# Patient Record
Sex: Female | Born: 1980
Health system: Southern US, Community
[De-identification: ages and names within clinical notes are randomized; demographics above are authoritative.]

## PROBLEM LIST (undated history)

## (undated) DIAGNOSIS — F419 Anxiety disorder, unspecified: Secondary | ICD-10-CM

## (undated) DIAGNOSIS — G4700 Insomnia, unspecified: Secondary | ICD-10-CM

## (undated) DIAGNOSIS — Z720 Tobacco use: Secondary | ICD-10-CM

## (undated) DIAGNOSIS — R51 Headache: Secondary | ICD-10-CM

## (undated) DIAGNOSIS — R519 Headache, unspecified: Secondary | ICD-10-CM

## (undated) HISTORY — DX: Anxiety disorder, unspecified: F41.9

## (undated) HISTORY — DX: Headache, unspecified: R51.9

## (undated) HISTORY — DX: Headache: R51

## (undated) HISTORY — DX: Insomnia, unspecified: G47.00

## (undated) HISTORY — PX: NO PAST SURGERIES: SHX2092

## (undated) HISTORY — DX: Tobacco use: Z72.0

---
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
## 2001-01-09 ENCOUNTER — Emergency Department (HOSPITAL_COMMUNITY): Admission: EM | Admit: 2001-01-09 | Discharge: 2001-01-09 | Payer: Self-pay | Admitting: Emergency Medicine

## 2001-01-09 ENCOUNTER — Encounter: Payer: Self-pay | Admitting: Emergency Medicine

## 2004-12-16 ENCOUNTER — Ambulatory Visit: Payer: Self-pay | Admitting: Family Medicine

## 2004-12-20 ENCOUNTER — Ambulatory Visit: Payer: Self-pay | Admitting: Family Medicine

## 2005-08-29 ENCOUNTER — Other Ambulatory Visit: Admission: RE | Admit: 2005-08-29 | Discharge: 2005-08-29 | Payer: Self-pay | Admitting: Obstetrics and Gynecology

## 2006-06-04 ENCOUNTER — Ambulatory Visit: Payer: Self-pay | Admitting: Family Medicine

## 2006-06-05 ENCOUNTER — Ambulatory Visit: Payer: Self-pay | Admitting: Family Medicine

## 2006-07-09 ENCOUNTER — Encounter: Admission: RE | Admit: 2006-07-09 | Discharge: 2006-07-09 | Payer: Self-pay | Admitting: Family Medicine

## 2006-07-09 ENCOUNTER — Ambulatory Visit: Payer: Self-pay | Admitting: Family Medicine

## 2006-08-14 ENCOUNTER — Emergency Department (HOSPITAL_COMMUNITY): Admission: EM | Admit: 2006-08-14 | Discharge: 2006-08-14 | Payer: Self-pay | Admitting: Emergency Medicine

## 2006-10-24 ENCOUNTER — Ambulatory Visit: Payer: Self-pay | Admitting: Family Medicine

## 2007-02-04 ENCOUNTER — Ambulatory Visit: Payer: Self-pay | Admitting: Internal Medicine

## 2007-02-15 ENCOUNTER — Ambulatory Visit: Payer: Self-pay | Admitting: Family Medicine

## 2007-02-15 LAB — CONVERTED CEMR LAB: hCG, Beta Chain, Quant, S: <5 milliintl units/mL

## 2008-01-19 DIAGNOSIS — J069 Acute upper respiratory infection, unspecified: Secondary | ICD-10-CM

## 2008-01-19 HISTORY — DX: Acute upper respiratory infection, unspecified: J06.9

## 2008-01-24 ENCOUNTER — Telehealth: Payer: Self-pay | Admitting: Family Medicine

## 2008-01-24 ENCOUNTER — Ambulatory Visit: Payer: Self-pay | Admitting: Family Medicine

## 2008-01-24 DIAGNOSIS — E663 Overweight: Secondary | ICD-10-CM

## 2008-01-24 DIAGNOSIS — N912 Amenorrhea, unspecified: Secondary | ICD-10-CM

## 2008-01-24 DIAGNOSIS — F17200 Nicotine dependence, unspecified, uncomplicated: Secondary | ICD-10-CM | POA: Insufficient documentation

## 2008-01-24 HISTORY — DX: Nicotine dependence, unspecified, uncomplicated: F17.200

## 2008-01-24 HISTORY — DX: Overweight: E66.3

## 2008-01-24 HISTORY — DX: Amenorrhea, unspecified: N91.2

## 2008-01-24 LAB — CONVERTED CEMR LAB: Beta hcg, urine, semiquantitative: NEGATIVE

## 2010-09-14 ENCOUNTER — Encounter: Admission: RE | Admit: 2010-09-14 | Discharge: 2010-09-14 | Payer: Self-pay | Admitting: Gastroenterology

## 2011-08-31 ENCOUNTER — Telehealth: Payer: Self-pay | Admitting: Family Medicine

## 2011-08-31 ENCOUNTER — Ambulatory Visit (INDEPENDENT_AMBULATORY_CARE_PROVIDER_SITE_OTHER): Payer: PRIVATE HEALTH INSURANCE | Admitting: Family Medicine

## 2011-08-31 ENCOUNTER — Encounter: Payer: Self-pay | Admitting: Family Medicine

## 2011-08-31 VITALS — BP 110/80 | Temp 98.2°F | Ht 61.0 in | Wt 203.0 lb

## 2011-08-31 DIAGNOSIS — F411 Generalized anxiety disorder: Secondary | ICD-10-CM

## 2011-08-31 DIAGNOSIS — F17200 Nicotine dependence, unspecified, uncomplicated: Secondary | ICD-10-CM

## 2011-08-31 DIAGNOSIS — N912 Amenorrhea, unspecified: Secondary | ICD-10-CM

## 2011-08-31 HISTORY — DX: Generalized anxiety disorder: F41.1

## 2011-08-31 MED ORDER — LORAZEPAM 1 MG PO TABS: 1.0000 mg | ORAL_TABLET | Freq: Two times a day (BID) | ORAL | Status: AC

## 2011-08-31 NOTE — Progress Notes (Signed)
Addended by: Bonnye Fava on: 08/31/2011 11:58 AM   Modules accepted: Orders

## 2011-08-31 NOTE — Patient Instructions (Signed)
Call and make arrangements to see Renee Barrett, ASAP also, call and get an appointment to see Dr. [name], [name].  Once the anxiety issue is addressed and then lets retry the smoking cessation program with the chantix........  We will call you this afternoon about your blood work

## 2011-08-31 NOTE — Telephone Encounter (Signed)
Pt called req lab results. Pls call asap.   °

## 2011-08-31 NOTE — Progress Notes (Signed)
  Subjective:    Patient ID: Renee Barrett, female    DOB: 1981-05-28, 30 y.o.   MRN: [id number]  HPI Renee Barrett is a 30 year old single female, who smoker, one pack of cigarettes a day, and comes in today for evaluation of 3 problems.  Her most pressing problem now as anxiety.  She states she felt this way for many years.  However, in the last couple months it's gotten worse.  Is now affecting her job.  She states her mother and sister both have anxiety and depression.  She got some Xanax at an urgent care and would like to take that.  I recommended she take a low dose Ativan, and we will help set up for psychiatric evaluation, which will include counseling with Judithe Modest, and a medical evaluation by Dr. Prescilla Sours.  She's tried Prozac, Zoloft, antidepressants in the past, and they don't seem to help.  He also has side effects from the medication.  She sees Dr. Henderson Cloud because of possible PCO left eye and has irregular periods.  She is not using anything for birth control.  She had a normal period 826 to 57 and then when she was to just had a day or two of a little spotting.  She does not feel pregnant.  However, she would like to serum pregnancy test.  We have tried two rounds of Entex because of her tobacco abuse, and she states chantix doesn't work.   Review of Systems    General psychiatric GYN review of systems otherwise negative Objective:   Physical Exam  Slightly obese, female, in no acute distress.  Examination the abdomen is negative.  She is appropriate.  She is oriented x 3 not tearful.  Not suicidal      Assessment & Plan:  Chronic anxiety disorder.  Plan refer to Dr. Rolly Pancake, [name], and Judithe Modest for counseling.  Ativan 1 mg b.i.d. Temporarily.  Serum pregnancy test to rule out pregnancy.  Since she is amenorrheic.  History of tobacco abuse.  We tried the smoking cessation program once her anxiety has been addressed

## 2011-09-01 ENCOUNTER — Telehealth: Payer: Self-pay | Admitting: *Deleted

## 2011-09-01 NOTE — Telephone Encounter (Signed)
Please call pt re: HCG results.  She REALLY wants to know today.

## 2011-09-01 NOTE — Telephone Encounter (Signed)
Patient is aware of lab report

## 2011-12-08 ENCOUNTER — Encounter: Payer: Self-pay | Admitting: Dietician

## 2011-12-08 ENCOUNTER — Encounter: Payer: PRIVATE HEALTH INSURANCE | Attending: Obstetrics and Gynecology | Admitting: Dietician

## 2011-12-08 DIAGNOSIS — Z713 Dietary counseling and surveillance: Secondary | ICD-10-CM | POA: Insufficient documentation

## 2011-12-08 DIAGNOSIS — E663 Overweight: Secondary | ICD-10-CM

## 2011-12-08 DIAGNOSIS — E669 Obesity, unspecified: Secondary | ICD-10-CM | POA: Insufficient documentation

## 2011-12-08 NOTE — Patient Instructions (Signed)
   Try to keep your carb intake to 30 gm per meal.  Read labels and aim for sugar levels of 0-9 gm per serving.  Aim for fiber of at least 2-3 gm or more per serving.  Get back to the gym, aim for 3-4 times per week.  Remember the exercise will lower blood glucose and limit insulin production.  Exercise will decrease insulin resistance.  [employer] is a web site that will facilitate weight loss.  Use the calorieking.com site or google the restaurant for the carb values.

## 2011-12-08 NOTE — Progress Notes (Signed)
  Medical Nutrition Therapy:  Appt start time: 0800 end time:  0900.  Assessment:  Primary concerns today: Weight loss.  Has established a goal of loosing 50 lb by June of 2013.  She has steadily gained and maintained 30-35 lb since high school.  She has had issues with irregular periods and primarily no periods the majority of the time.  She is diagnosed with Metabolic syndrome along with PCOS.  She has tried many times to lose weight, only to regain the weight.  She is becoming engaged and wishes to plan marriage and pregnancy.  Appears willing to begin to make the life style changes that are necessary for the weight loss. Uses regular soda and sweetened tea, frequents fast food restaurants and does little meal/food planning. Does cook and her significant other enjoys grocery shopping.   MEDICATIONS: Prilosec OTC and Sprintec for birth control.  DIETARY INTAKE:  24-hr recall:  B (7:30 AM): Biscuit/ham, egg, cheese, with fries ([employer]), Sweet tea, small eats 1/2 of the meal OR Oatmeal(weight control or flavored) yogurt, fat free, tea sweet tea.  Weekends are two meals per day.   Snk (none AM) :none  L (12:30 PM): [employer] 2 pieces of fried chicken and mashed potatoes, and sweet tea.  OR Grill chicken sandwich or a salad or [place] wrap OR leftovers from dinner.  Snk ( PM): none D (6:30 PM): BBQ sandwich and mash potatoes. And sweet tea. 16 oz.  Snk ( PM): none Beverages: Sweet tea and soda, Dr. Reino Kent or [facility].   Recent physical activity: trying to get to the gym for 60 minutes of cardio classes 2-3 times per week. For the last few weeks has not been as successful at getting there.  Encouraged her to aim for 3-4 times for week and as much activity as possible.    Estimated energy needs:Ht:61 inches, Wt:207.7 lb (94.4 kg) Adjusted Wt: 138 lb. (63 Kg) 1300 calories 140-145 g carbohydrates 95-100 g protein 34-36 g fat  Progress Towards Goal(s):  In progress.   Nutritional Diagnosis:    [place]-3.3 Overweight/obesity As related to frequently eating at fast food restaurants, increased use of sweetened beverages and fried and fatty foods.  As evidenced by Weight of 207.7 lb with a BMI of 38.7%.    Intervention:  Nutrition Review of weight loss recommendations.  Primarily, needs to omit the concentrated sweets and fatty foods.  Has a regular meal pattern, but has no planning is dependent on the time and fast foods.  Stressed planning.    Handouts given during visit include:  Provided a menu plan for 30 gm or CHO per meal  Provided the Novo Nordisk Carb Counting guide for referencing her serving sizes and food suggestions.  Provided the Weight Loss Guide with individualized strategies to aim for.  Monitoring/Evaluation:  Dietary intake, exercise,  and body weight to call for follow-up as her insurance carrier Katarzyna Wolven allow.  Advised her to e-mail questions to me and to utilize the scales at [facility] for checking her weight periodically.

## 2012-12-30 ENCOUNTER — Encounter (HOSPITAL_BASED_OUTPATIENT_CLINIC_OR_DEPARTMENT_OTHER): Payer: Self-pay | Admitting: *Deleted

## 2012-12-30 ENCOUNTER — Emergency Department (HOSPITAL_BASED_OUTPATIENT_CLINIC_OR_DEPARTMENT_OTHER)
Admission: EM | Admit: 2012-12-30 | Discharge: 2012-12-30 | Disposition: A | Discharge status: Home / Self Care | Payer: Commercial Managed Care - PPO | Attending: Emergency Medicine | Admitting: Emergency Medicine

## 2012-12-30 DIAGNOSIS — M436 Torticollis: Secondary | ICD-10-CM | POA: Insufficient documentation

## 2012-12-30 DIAGNOSIS — F17200 Nicotine dependence, unspecified, uncomplicated: Secondary | ICD-10-CM | POA: Insufficient documentation

## 2012-12-30 DIAGNOSIS — M542 Cervicalgia: Secondary | ICD-10-CM | POA: Insufficient documentation

## 2012-12-30 DIAGNOSIS — Z79899 Other long term (current) drug therapy: Secondary | ICD-10-CM | POA: Insufficient documentation

## 2012-12-30 MED ORDER — IBUPROFEN 600 MG PO TABS: 600.0000 mg | ORAL_TABLET | Freq: Four times a day (QID) | ORAL | Status: DC | PRN

## 2012-12-30 MED ORDER — OXYCODONE-ACETAMINOPHEN 5-325 MG PO TABS: 1.0000 | ORAL_TABLET | Freq: Four times a day (QID) | ORAL | Status: DC | PRN

## 2012-12-30 MED ORDER — IBUPROFEN 800 MG PO TABS
800.0000 mg | ORAL_TABLET | Freq: Once | ORAL | Status: AC
  Administered 2012-12-30: 800 mg via ORAL
  Filled 2012-12-30: qty 1

## 2012-12-30 MED ORDER — CYCLOBENZAPRINE HCL 5 MG PO TABS: 5.0000 mg | ORAL_TABLET | Freq: Three times a day (TID) | ORAL | Status: DC | PRN

## 2012-12-30 NOTE — ED Notes (Addendum)
Pt c/o neck pain and stiffness x 2 days denies injury

## 2012-12-30 NOTE — ED Provider Notes (Signed)
History  This chart was scribed for Ethelda Chick, MD by Shari Heritage, ED Scribe. The patient was seen in room MH05/MH05. Patient's care was started at 1527.  CSN: [id number]  Arrival date & time 12/30/12  1520   First MD Initiated Contact with Patient 12/30/12 1527      Chief Complaint  Patient presents with  . Torticollis     Patient is a 32 y.o. female presenting with musculoskeletal pain. The history is provided by the patient. No language interpreter was used.  Muscle Pain This is a new problem. The current episode started 2 days ago. The problem occurs constantly. The problem has not changed since onset.Nothing relieves the symptoms. Treatments tried: muscle relaxers, narcotic pain medicines. The treatment provided no relief.    HPI Comments: Renee Barrett is a 32 y.o. female who presents to the Emergency Department complaining of moderate, constant, non-radiating, dull right neck pain and neck stiffness onset 2 days ago. Patient states that she has a history of "whiplash" and has had occasional pain flare ups. She denies any new injury or trauma to the neck. No fever, numbness or weakness in upper extremities. Patient says that she saw her chiropractor today for a readjustment, but she actually felt worse after the appointment. Patient says that she took 1 muscle relaxer and 1 narcotic pain pill 2.5 hours ago with no significant relief. Patient reports no other significant past medical or surgical history.   Past Medical History  Diagnosis Date  . Tobacco abuse     History reviewed. No pertinent past surgical history.  History reviewed. No pertinent family history.  History  Substance Use Topics  . Smoking status: Current Every Day Smoker -- 1.0 packs/day    Types: Cigarettes  . Smokeless tobacco: Never Used  . Alcohol Use: Yes    OB History    Grav Para Term Preterm Abortions TAB SAB Ect Mult Living                  Review of Systems  Constitutional: Negative  for fever.  HENT: Positive for neck pain and neck stiffness.   Neurological: Negative for weakness and numbness.  All other systems reviewed and are negative.    Allergies  Review of patient's allergies indicates no known allergies.  Home Medications   Current Outpatient Rx  Name  Route  Sig  Dispense  Refill  . CYCLOBENZAPRINE HCL 5 MG PO TABS   Oral   Take 1 tablet (5 mg total) by mouth 3 (three) times daily as needed for muscle spasms.   20 tablet   0   . IBUPROFEN 600 MG PO TABS   Oral   Take 1 tablet (600 mg total) by mouth every 6 (six) hours as needed for pain.   30 tablet   0   . SPRINTEC 28 PO   Oral   Take 1 tablet by mouth daily.           Marland Kitchen OMEPRAZOLE 20 MG PO CPDR   Oral   Take 20 mg by mouth 2 (two) times daily.           . OXYCODONE-ACETAMINOPHEN 5-325 MG PO TABS   Oral   Take 1-2 tablets by mouth every 6 (six) hours as needed for pain.   15 tablet   0     Triage Vitals: BP 120/76  Pulse 81  Temp 97.8 F (36.6 C) (Oral)  Resp 16  Ht 5' (1.524 m)  Wt  206 lb (93.441 kg)  BMI 40.23 kg/m2  SpO2 99%  LMP 12/05/2012  Physical Exam  Nursing note and vitals reviewed. Constitutional: She is oriented to person, place, and time. She appears well-developed and well-nourished.  HENT:  Head: Normocephalic and atraumatic.  Eyes: Conjunctivae normal and EOM are normal. Pupils are equal, round, and reactive to light.  Neck: Normal range of motion. Neck supple.  Cardiovascular: Normal rate, regular rhythm and normal heart sounds.   Pulmonary/Chest: Effort normal and breath sounds normal.  Abdominal: Soft. Bowel sounds are normal.  Musculoskeletal: Normal range of motion. She exhibits tenderness.       Tenderness to palpation over right cervical paraspinous and trapezius distribution with ROM limited due to pain. No midline tenderness to palpation of cervical region.  Neurological: She is alert and oriented to person, place, and time.       Strength  5/5 and symmetric in bilateral upper extremities. Sensation intact.   Skin: Skin is warm and dry.  Psychiatric: She has a normal mood and affect.    ED Course  Procedures (including critical care time) DIAGNOSTIC STUDIES: Oxygen Saturation is 99% on room air, normal by my interpretation.    COORDINATION OF CARE: 3:46 PM- Patient informed of current plan for treatment and evaluation and agrees with plan at this time.      Labs Reviewed - No data to display No results found.   1. Torticollis       MDM  Pt presenting with c/o right sided neck pain.  Has hx of "whiplash" and pain is worse after chiropractic manipulation today. No new trauma.  No weakness or radiation of pain into upper extremity.  Advised ibuprofen, muscle relaxer, pain medication.  No indication for imaging today.  D/w patient that if symptoms continue she may need an MRI on an outpatient basis.  Discharged with strict return precautions.  Pt agreeable with plan.   I personally performed the services described in this documentation, which was scribed in my presence. The recorded information has been reviewed and is accurate.    Ethelda Chick, MD 12/30/12 [phone]

## 2013-01-24 ENCOUNTER — Encounter: Payer: Self-pay | Admitting: Family Medicine

## 2013-01-24 ENCOUNTER — Telehealth: Payer: Self-pay | Admitting: Family Medicine

## 2013-01-24 ENCOUNTER — Ambulatory Visit (INDEPENDENT_AMBULATORY_CARE_PROVIDER_SITE_OTHER): Payer: Commercial Managed Care - PPO | Admitting: Family Medicine

## 2013-01-24 VITALS — BP 110/80 | HR 108 | Temp 98.1°F | Wt 218.0 lb

## 2013-01-24 DIAGNOSIS — M542 Cervicalgia: Secondary | ICD-10-CM

## 2013-01-24 DIAGNOSIS — L089 Local infection of the skin and subcutaneous tissue, unspecified: Secondary | ICD-10-CM

## 2013-01-24 MED ORDER — DOXYCYCLINE HYCLATE 100 MG PO TABS: 100.0000 mg | ORAL_TABLET | Freq: Two times a day (BID) | ORAL | Status: DC

## 2013-01-24 NOTE — Patient Instructions (Addendum)
Take antibiotic for finger  For neck pain: -heat for 15 minutes twice daily -topical sports creams for pain -Home Exercises  Follow up with your doctor in 1 month

## 2013-01-24 NOTE — Telephone Encounter (Signed)
Error

## 2013-01-24 NOTE — Progress Notes (Signed)
Chief Complaint  Patient presents with  . reocurrening left index finger infection  . pinched nerve in neck affecting wrist and eye twiching    HPI:  Acute visit:  Infected finger: -started 2 days ago -pain and a little bump on L index finger -has happened a few times in the past - L index finger -has had thourough work up in the past and report now if catches early with doxy it does not have to be drained -last time it got infected was about 1 years ago  ? Pinched nerve in neck: -tingling in bilat first few fingers and and forearms -neck pain from time to time -has a hx of carpal tunnel and cock up brace helped -also seen in ED in the past for her neck pain and told to follow up with PCP and might have DDD -neck pain has improved -also has twitching in eyelid on and off for one month   ROS: See pertinent positives and negatives per HPI.  Past Medical History  Diagnosis Date  . Tobacco abuse     No family history on file.  History   Social History  . Marital Status: Single    Spouse Name: N/A    Number of Children: N/A  . Years of Education: N/A   Social History Main Topics  . Smoking status: Current Every Day Smoker -- 1.00 packs/day    Types: Cigarettes  . Smokeless tobacco: Never Used  . Alcohol Use: Yes  . Drug Use: No  . Sexually Active: Yes    Birth Control/ Protection: None   Other Topics Concern  . None   Social History Narrative  . None    Current outpatient prescriptions:Norgestimate-Eth Estradiol (SPRINTEC 28 PO), Take 1 tablet by mouth daily.  , Disp: , Rfl: ;  cyclobenzaprine (FLEXERIL) 5 MG tablet, Take 1 tablet (5 mg total) by mouth 3 (three) times daily as needed for muscle spasms., Disp: 20 tablet, Rfl: 0;  doxycycline (VIBRA-TABS) 100 MG tablet, Take 1 tablet (100 mg total) by mouth 2 (two) times daily., Disp: 20 tablet, Rfl: 0 ibuprofen (ADVIL,MOTRIN) 600 MG tablet, Take 1 tablet (600 mg total) by mouth every 6 (six) hours as needed for  pain., Disp: 30 tablet, Rfl: 0;  omeprazole (PRILOSEC) 20 MG capsule, Take 20 mg by mouth 2 (two) times daily.  , Disp: , Rfl: ;  oxyCODONE-acetaminophen (PERCOCET/ROXICET) 5-325 MG per tablet, Take 1-2 tablets by mouth every 6 (six) hours as needed for pain., Disp: 15 tablet, Rfl: 0  EXAM:  Filed Vitals:   01/24/13 1042  BP: 110/80  Pulse: 108  Temp: 98.1 F (36.7 C)    Body mass index is 42.58 kg/(m^2).  GENERAL: vitals reviewed and listed above, alert, oriented, appears well hydrated and in no acute distress  HEENT: atraumatic, conjunttiva clear, no obvious abnormalities on inspection of external nose and ears  NECK: no obvious masses on inspection - minimal post cervical paraspinal muscle and trapezius muscle TTP, normal ROM in neck, neg Spurling, normal muscle strength and sensation throughout in UEs bilat  MS: moves all extremities without noticeable abnormality  SKIN: L index finger with very small erythematous papule  PSYCH: pleasant and cooperative, no obvious depression or anxiety  ASSESSMENT AND PLAN:  Discussed the following assessment and plan:  Finger infection - Plan: doxycycline (VIBRA-TABS) 100 MG tablet  Neck pain  -doxy given for finger per hx  -suspect postural related muscle strain as etiology for neck pain and mild carpal  tunnel -advised cock up brace at night for wrist and provided HEP for neck -pt will follow up with PCP -Patient advised to return or notify a doctor immediately if symptoms worsen or persist or new concerns arise.  Patient Instructions  Take antibiotic for finger  For neck pain: -heat for 15 minutes twice daily -topical sports creams for pain -Home Exercises  Follow up with your doctor in 1 month     KIM, [facility] R.

## 2013-02-05 ENCOUNTER — Telehealth: Payer: Self-pay | Admitting: Family Medicine

## 2013-02-05 NOTE — Telephone Encounter (Signed)
Pt was taking abx now has yeast inf. Please call in 2 diflucan pill into [facility] [phone]

## 2013-02-05 NOTE — Telephone Encounter (Signed)
Pls advise.  

## 2013-02-06 MED ORDER — FLUCONAZOLE 150 MG PO TABS: ORAL_TABLET | ORAL | Status: DC

## 2013-02-06 NOTE — Addendum Note (Signed)
Addended by: Azucena Freed on: 02/06/2013 10:55 AM   Modules accepted: Orders

## 2013-02-06 NOTE — Telephone Encounter (Signed)
Please let her know if any persistent symptoms, pelvic or abd pain, fevers, or urinary symptoms she needs to see a doctediimmediatly this medication interferes with the effectiveness of her birth control pills. Marland Kitchen

## 2013-02-06 NOTE — Telephone Encounter (Signed)
Called and spoke with pt and rx faxed to pharmacy.

## 2013-02-18 ENCOUNTER — Ambulatory Visit: Payer: Commercial Managed Care - PPO | Admitting: Family Medicine

## 2013-02-19 ENCOUNTER — Encounter: Payer: Self-pay | Admitting: Family Medicine

## 2013-02-19 ENCOUNTER — Ambulatory Visit (INDEPENDENT_AMBULATORY_CARE_PROVIDER_SITE_OTHER): Payer: Commercial Managed Care - PPO | Admitting: Family Medicine

## 2013-02-19 VITALS — BP 110/80 | Temp 98.3°F | Wt 217.0 lb

## 2013-02-19 DIAGNOSIS — G8929 Other chronic pain: Secondary | ICD-10-CM

## 2013-02-19 DIAGNOSIS — F411 Generalized anxiety disorder: Secondary | ICD-10-CM

## 2013-02-19 DIAGNOSIS — F418 Other specified anxiety disorders: Secondary | ICD-10-CM

## 2013-02-19 DIAGNOSIS — M549 Dorsalgia, unspecified: Secondary | ICD-10-CM

## 2013-02-19 MED ORDER — CYCLOBENZAPRINE HCL 5 MG PO TABS: 5.0000 mg | ORAL_TABLET | Freq: Every evening | ORAL | Status: DC | PRN

## 2013-02-19 NOTE — Patient Instructions (Addendum)
Try to establish regular aerobic exercise. Continue moist heat and muscle massage

## 2013-02-19 NOTE — Progress Notes (Signed)
  Subjective:    Patient ID: Renee Barrett, female    DOB: 07-Mar-1981, 32 y.o.   MRN: [id number]  HPI  Intermittent neck pain.   History of cervical pain for about 2 years Onset after moving neck and felt "pop". Usually right sided pain. No radiculopathy Chiropractic care made worse. Intermittent.  Sharp, "shock-like" pain-fleeting. No numbness or weakness. Treated with opioids and muscle relaxer in past. Flexeril helped slightly.  Deals with tremendous job stress. Symptoms feels she has difficulty coping. No depressive symptoms. She is considered counseling but is never pursued in the past. Briefly took Xanax in past which she thought helped  Past Medical History  Diagnosis Date  . Tobacco abuse    No past surgical history on file.  reports that she has been smoking Cigarettes.  She has been smoking about 1.00 pack per day. She has never used smokeless tobacco. She reports that  drinks alcohol. She reports that she does not use illicit drugs. family history is not on file. No Known Allergies    Review of Systems  Constitutional: Negative for fever, chills, appetite change and unexpected weight change.  HENT: Positive for neck pain.   Respiratory: Negative for cough and shortness of breath.   Cardiovascular: Negative for chest pain.  Musculoskeletal: Positive for back pain.  Neurological: Negative for weakness and numbness.  Hematological: Negative for adenopathy. Does not bruise/bleed easily.  Psychiatric/Behavioral: Negative for dysphoric mood and agitation. The patient is nervous/anxious.        Objective:   Physical Exam  Constitutional: She appears well-developed and well-nourished. No distress.  Neck: Neck supple. No thyromegaly present.  Cardiovascular: Normal rate and regular rhythm.   Pulmonary/Chest: Effort normal and breath sounds normal. No respiratory distress. She has no wheezes. She has no rales.  Musculoskeletal: She exhibits no edema.  Full range of  motion cervical spine  Neurological:  No upper extremity weakness or numbness. Symmetric upper extremity reflexes  Psychiatric: She has a normal mood and affect. Her behavior is normal.          Assessment & Plan:  #1 chronic intermittent upper back pain and neck pain. Suspect soft tissue muscular etiology. Her job requires extensive typing. Nonfocal neuro exam. Refilled Flexeril 5 mg each bedtime for as needed use. Moist heat. Topical rubs. Aerobic exercise such as walking.  Consider muscle massage and possible physical therapy if symptoms persist #2 situational stress/anxiety. We have recommended against any regular use of benzodiazepines. We strongly recommended counseling as a first step. Consider SSRI such as sertraline if symptoms persist or worsen

## 2013-03-04 ENCOUNTER — Encounter: Payer: Commercial Managed Care - PPO | Admitting: Family Medicine

## 2013-03-04 ENCOUNTER — Telehealth: Payer: Self-pay | Admitting: Family Medicine

## 2013-03-04 NOTE — Telephone Encounter (Signed)
Patient Information:  Caller Name: Bonita  Phone: [phone]  Patient: Renee Barrett, Renee Barrett  Gender: Female  DOB: 1981/02/28  Age: 32 Years  PCP: Evelena Peat [facility])  Pregnant: No  Office Follow Up:  Does the office need to follow up with this patient?: Yes  Instructions For The Office: Patient would like appt for heavy vaginal bleeding. Please contact [phone]  RN Note:  Patient is wanting an appt for heavy vaginal bleeding. Please contact her [phone].   to assist with scheduling for today.  Symptoms  Reason For Call & Symptoms: Patient states she has had her period for 31 days.  She has a history of PCOS and they started her  BCP and Metformin at the beginning of March. She see's   Dr.Horvath [facility]). She stopped BCP and Metformin due to nausea  .  She is cramping and bleeding and they cannot get her into their office.. She is wearing tampons and pads and is changing every 2 hours. Bright Red blood with small clots.   LMP 3/2 /2014.  Reviewed Health History In EMR: Yes  Reviewed Medications In EMR: Yes  Reviewed Allergies In EMR: Yes  Reviewed Surgeries / Procedures: Yes  Date of Onset of Symptoms: 02/02/2013  Treatments Tried: BCP and Metformin  Treatments Tried Worked: No OB / GYN:  LMP: 02/02/2013  Guideline(s) Used:  Vaginal Bleeding - Abnormal  Disposition Per Guideline:   See Today in Office  Reason For Disposition Reached:   Patient wants to be seen  Advice Given:  Call Back If:  Bleeding becomes worse  You become worse.  RN Overrode Recommendation:  Make Appointment  Patient would like appt for heavy vaginal bleeding. Please contact [phone]

## 2013-03-04 NOTE — Telephone Encounter (Signed)
Spoke with patient and an appointment made 

## 2013-03-04 NOTE — Progress Notes (Signed)
Per PCP needs to see gyn recently treating her for gyn issues. If severe bleeding, feeling lightheaded needs to go to hospital. This encounter was created in error - please disregard. This encounter was created in error - please disregard.

## 2013-08-27 ENCOUNTER — Ambulatory Visit (INDEPENDENT_AMBULATORY_CARE_PROVIDER_SITE_OTHER): Payer: Commercial Managed Care - PPO | Admitting: Family Medicine

## 2013-08-27 ENCOUNTER — Encounter: Payer: Self-pay | Admitting: Family Medicine

## 2013-08-27 VITALS — BP 120/80 | HR 87 | Temp 98.1°F | Wt 214.0 lb

## 2013-08-27 DIAGNOSIS — Z9109 Other allergy status, other than to drugs and biological substances: Secondary | ICD-10-CM

## 2013-08-27 DIAGNOSIS — G43909 Migraine, unspecified, not intractable, without status migrainosus: Secondary | ICD-10-CM

## 2013-08-27 DIAGNOSIS — M542 Cervicalgia: Secondary | ICD-10-CM

## 2013-08-27 DIAGNOSIS — G8929 Other chronic pain: Secondary | ICD-10-CM

## 2013-08-27 MED ORDER — SUMATRIPTAN SUCCINATE 100 MG PO TABS: 100.0000 mg | ORAL_TABLET | ORAL | Status: DC | PRN

## 2013-08-27 MED ORDER — DICLOFENAC SODIUM 75 MG PO TBEC: 75.0000 mg | DELAYED_RELEASE_TABLET | Freq: Two times a day (BID) | ORAL | Status: DC

## 2013-08-27 NOTE — Progress Notes (Signed)
  Subjective:    Patient ID: Renee Barrett, female    DOB: 11-09-1981, 32 y.o.   MRN: [id number]  HPI Here for 3 things. First she has chronic pain in the neck, the lower back , and the legs. Advil no longer helps like it used to. Also she has had a cough for several week, along with some PND. NO fever. Also she has had some HAs in the past month or two which start around the left eye and then generalize. They become more severe and they throb. Her vision gets blurred and she gets nauseated. She was diagnosed with migraines some years ago.    Review of Systems  Constitutional: Negative.   HENT: Positive for neck pain and neck stiffness.   Eyes: Positive for visual disturbance.  Respiratory: Negative.   Cardiovascular: Negative.   Musculoskeletal: Positive for back pain.  Neurological: Positive for headaches. Negative for dizziness, tremors, seizures, syncope, facial asymmetry, weakness, light-headedness and numbness.       Objective:   Physical Exam  Constitutional: She is oriented to person, place, and time. She appears well-developed and well-nourished.  HENT:  Head: Normocephalic and atraumatic.  Right Ear: External ear normal.  Left Ear: External ear normal.  Nose: Nose normal.  Mouth/Throat: Oropharynx is clear and moist.  Eyes: Conjunctivae and EOM are normal. Pupils are equal, round, and reactive to light.  Neck: Neck supple. No thyromegaly present.  Pulmonary/Chest: Effort normal and breath sounds normal.  Lymphadenopathy:    She has no cervical adenopathy.  Neurological: She is alert and oriented to person, place, and time. She has normal reflexes. No cranial nerve deficit. She exhibits normal muscle tone. Coordination normal.          Assessment & Plan:  Try Diclofenac prn for neck or back pain. She is having migraines so she will try Sumatriptan for these. The cough is probably allergic so she will try Zyrtec or Allegra.

## 2014-07-20 ENCOUNTER — Telehealth: Payer: Self-pay | Admitting: Family Medicine

## 2014-07-20 NOTE — Telephone Encounter (Signed)
Patient Information:  Caller Name: Marchelle Folksmanda  Phone: [phone]  Patient: Renee Barrett, [place]  Gender: Female  DOB: 05-13-1981  Age: 33 Years  PCP: Kelle Dartingodd, [facility](Family Practice)  Pregnant: No  Office Follow Up:  Does the office need to follow up with this patient?: No  Instructions For The Office: N/A  RN Note:  Dr Tawanna Coolerodd spoke with patient - scheduling appointment tomorrow 8/18  Symptoms  Reason For Call & Symptoms: chest pain 8/5 so much pain in chest could not get comfortable and pain lasted for hours,  Pain in center of chest, felt hot, dizzy, lightheaded and felt like going to pass out.  Took ASA 325mg .  Pain severe again on Saturday 8/8, this time broke out in sweat, pain in chest and left arm and fingers became numb.  Felt tired and weak, thought might be panic attack so took Xanax, layed down and rested.  Woke from sleep for 3rd episode on Saturday 8/15, felt like squeezing in chest for 2-4 minutes then went back to sleep.  At times dull ache or soreness in chest when presses.  No current chest pain or pressure, just soreness when touches.  Reviewed Health History In EMR: Yes  Reviewed Medications In EMR: Yes  Reviewed Allergies In EMR: Yes  Reviewed Surgeries / Procedures: Yes  Date of Onset of Symptoms: 07/08/2014  Treatments Tried: ASA 325 mg on 8/5  Treatments Tried Worked: No OB / GYN:  LMP: 06/03/2014  Guideline(s) Used:  Chest Pain  Disposition Per Guideline:   See Today in Office  Reason For Disposition Reached:   Intermittent chest pains persist > 3 days  Advice Given:  N/A  Patient Will Follow Care Advice:  YES

## 2014-07-21 ENCOUNTER — Ambulatory Visit (INDEPENDENT_AMBULATORY_CARE_PROVIDER_SITE_OTHER): Payer: Commercial Managed Care - PPO | Admitting: Family Medicine

## 2014-07-21 ENCOUNTER — Encounter: Payer: Self-pay | Admitting: Family Medicine

## 2014-07-21 VITALS — BP 110/80 | Temp 98.0°F | Wt 212.0 lb

## 2014-07-21 DIAGNOSIS — R519 Headache, unspecified: Secondary | ICD-10-CM | POA: Insufficient documentation

## 2014-07-21 DIAGNOSIS — R0789 Other chest pain: Secondary | ICD-10-CM

## 2014-07-21 DIAGNOSIS — G8929 Other chronic pain: Secondary | ICD-10-CM

## 2014-07-21 DIAGNOSIS — R071 Chest pain on breathing: Secondary | ICD-10-CM

## 2014-07-21 DIAGNOSIS — R51 Headache: Secondary | ICD-10-CM

## 2014-07-21 HISTORY — DX: Headache, unspecified: R51.9

## 2014-07-21 HISTORY — DX: Other chronic pain: G89.29

## 2014-07-21 HISTORY — DX: Other chest pain: R07.89

## 2014-07-21 NOTE — Progress Notes (Signed)
Pre visit review using our clinic review tool, if applicable. No additional management support is needed unless otherwise documented below in the visit note. 

## 2014-07-21 NOTE — Patient Instructions (Signed)
Motrin 600 mg twice daily with food  Neuro consult

## 2014-07-21 NOTE — Progress Notes (Signed)
   Subjective:    Patient ID: Renee Barrett, female    DOB: [birth year], 33 y.o.   MRN: [id number]  HPI Renee Barrett is a 33 year old female nonsmoker who comes in today for evaluation of a couple problems  She states on August 5 she was sitting at home working and developed a sharp upper anterior chest pain that seem to shoot into her back. This has been present since that time. It waxes and wanes in terms of intensity. It hurts if she presses on her chest it also hurts if she moves or takes a deep breath. No cardiac or pulmonary symptoms.  She had an episode of numbness in her left arm however she reports that she had a whiplash injury at one time. The numbness has since resolved.  She's been on birth control now for 6 months because her S. so had a vasectomy. The birth control pills apparently help stop her migraine headaches. Now she is back to having daily headaches.  She also states at some point somebody gave her some Xanax for tension and wants to know if I can give her some Xanax.......... I declined   Review of Systems Negative review of systems    Objective:   Physical Exam  Well-developed and nourished female no acute distress vital signs stable she's afebrile except for weight 212 pounds cardiopulmonary exam normal there is tenderness in right upper anterior chest wall palpation      Assessment & Plan:  Chest wall pain,,,,,,,,,,,,,, reassured,,,,,,,,, Motrin 600 twice a day  Chronic daily headaches,,,,,,,,,, neuro consult  Episode of weakness in her left arm question cervical disc,,,,,,,, also evaluated by neurology,

## 2014-09-09 ENCOUNTER — Ambulatory Visit (INDEPENDENT_AMBULATORY_CARE_PROVIDER_SITE_OTHER): Payer: Commercial Managed Care - PPO | Admitting: Neurology

## 2014-09-09 ENCOUNTER — Encounter: Payer: Self-pay | Admitting: Neurology

## 2014-09-09 VITALS — BP 108/82 | HR 100 | Resp 20 | Ht 61.0 in | Wt 216.2 lb

## 2014-09-09 DIAGNOSIS — F410 Panic disorder [episodic paroxysmal anxiety] without agoraphobia: Secondary | ICD-10-CM

## 2014-09-09 DIAGNOSIS — G43009 Migraine without aura, not intractable, without status migrainosus: Secondary | ICD-10-CM

## 2014-09-09 MED ORDER — AMITRIPTYLINE HCL 10 MG PO TABS: 10.0000 mg | ORAL_TABLET | Freq: Every day | ORAL | Status: DC

## 2014-09-09 NOTE — Patient Instructions (Signed)
1.  Start amitriptyline 10mg  at bedtime.  Call in 4 weeks with update 2.  Take Excedrin at earliest onset of headache.   3.  Stop caffeine.  4.  Limit use of pain relievers to no more than 2 days out of the week. 5.  Keep headache diary 6.  Follow up in 3 months.

## 2014-09-09 NOTE — Progress Notes (Signed)
NEUROLOGY CONSULTATION NOTE  Renee Barrett MRN: [id number] DOB: [birth year]  Referring provider: Dr. Tawanna Cooler Primary care provider: Dr. Tawanna Cooler  Reason for consult:  Headache  HISTORY OF PRESENT ILLNESS: Renee Barrett is a 33 year old right-handed woman with history of panic attacks who presents for headache.  Records reviewed.  Onset:  Since 33 years old. Location:  Varies (right retro-orbital, center of forehead, back of head) Quality:  Pressure, throbbing, stabbing Intensity:  8-9/10 for severe, 4-5/10 moderate Aura:  no Prodrome:  no Associated symptoms:  Nausea, phonophobia.  No photophobia, visual symptoms or autonomic symptoms. Duration:  1 hour with Excedrin, otherwise 4hrs Frequency:  6 days per month (1 day per month for severe).  Not associated with her period. Triggers/exacerbating factors:  Caffeine withdraw, hunger Relieving factors:  Eating a big meal, caffeine, sleep Activity:  Cannot function for severe migraines  Past abortive therapy:  sumatriptan po 100mg  (flu-like symptoms), ibuprofen 600mg  (lost effectiveness), Excedrin (helped) Past preventative therapy:  None Other past medications:  Tried Zoloft for depression, but had side effects.  Current abortive therapy:  acetaminophen 650mg  Current preventative therapy:  none  Caffeine:  1 cup of coffee and 16-20 oz Dr. Reino Kent daily Alcohol:  no Smoker:  1 ppd (trying to quit) Diet:  Could be better Exercise:  Not lately because she does not feel well Depression/stress:  She has depression. Sleep hygiene:  Overall, it is good. Family history of headache:  Mom, maternal grandmother  PAST MEDICAL HISTORY: Past Medical History  Diagnosis Date  . Tobacco abuse   . Headache     PAST SURGICAL HISTORY: No past surgical history on file.  MEDICATIONS: Current Outpatient Prescriptions on File Prior to Visit  Medication Sig Dispense Refill  . acetaminophen (TYLENOL) 325 MG tablet Take 650 mg by mouth every 6  (six) hours as needed.      Marland Kitchen omeprazole (PRILOSEC) 20 MG capsule Take 20 mg by mouth 2 (two) times daily.         No current facility-administered medications on file prior to visit.    ALLERGIES: No Known Allergies  FAMILY HISTORY: Family History  Problem Relation Age of Onset  . Thyroid disease Mother   . Diabetes Maternal Grandfather   . Cancer Paternal Grandmother     breast   . Rheum arthritis Mother     SOCIAL HISTORY: History   Social History  . Marital Status: Single    Spouse Name: N/A    Number of Children: N/A  . Years of Education: N/A   Occupational History  . Not on file.   Social History Main Topics  . Smoking status: Current Every Day Smoker -- 1.00 packs/day    Types: Cigarettes  . Smokeless tobacco: Never Used     Comment: pateint is aware this is unhealthy   . Alcohol Use: Yes     Comment: occ  . Drug Use: No  . Sexual Activity: Yes    Partners: Male    Birth Control/ Protection: None   Other Topics Concern  . Not on file   Social History Narrative  . No narrative on file    REVIEW OF SYSTEMS: Constitutional: No fevers, chills, or sweats, no generalized fatigue, change in appetite Eyes: No visual changes, double vision, eye pain Ear, nose and throat: No hearing loss, ear pain, nasal congestion, sore throat Cardiovascular: No chest pain, palpitations Respiratory:  No shortness of breath at rest or with exertion, wheezes GastrointestinaI: No nausea, vomiting, diarrhea,  abdominal pain, fecal incontinence Genitourinary:  No dysuria, urinary retention or frequency Musculoskeletal:  No neck pain, back pain Integumentary: No rash, pruritus, skin lesions Neurological: as above Psychiatric: No depression, insomnia, anxiety Endocrine: No palpitations, fatigue, diaphoresis, mood swings, change in appetite, change in weight, increased thirst Hematologic/Lymphatic:  No anemia, purpura, petechiae. Allergic/Immunologic: no itchy/runny eyes, nasal  congestion, recent allergic reactions, rashes  PHYSICAL EXAM: Filed Vitals:   09/09/14 0826  BP: 108/82  Pulse: 100  Resp: 20   General: No acute distress Head:  Normocephalic/atraumatic Neck: supple, no paraspinal tenderness, full range of motion Back: No paraspinal tenderness Heart: regular rate and rhythm Lungs: Clear to auscultation bilaterally. Vascular: No carotid bruits. Neurological Exam: Mental status: alert and oriented to person, place, and time, recent and remote memory intact, fund of knowledge intact, attention and concentration intact, speech fluent and not dysarthric, language intact. Cranial nerves: CN I: not tested CN II: pupils equal, round and reactive to light, visual fields intact, fundi unremarkable, without vessel changes, exudates, hemorrhages or papilledema. CN III, IV, VI:  full range of motion, no nystagmus, no ptosis CN V: facial sensation intact CN VII: upper and lower face symmetric CN VIII: hearing intact CN IX, X: gag intact, uvula midline CN XI: sternocleidomastoid and trapezius muscles intact CN XII: tongue midline Bulk & Tone: normal, no fasciculations. Motor: 5/5 throughout Sensation: temperature and vibration intact. Deep Tendon Reflexes: 2+ throughout, toes downgoing Finger to nose testing: no dysmetria Heel to shin: no dysmetria Gait: normal station and stride.  Able to turn and walk in tandem. Romberg negative.  IMPRESSION: Migraine without aura  PLAN: 1.  Will try amitriptyline 10mg  at bedtime.  She cannot take a medication that is only a capsule (difficulty swallowing) 2.  Excedrin for abortive therapy 3.  Stop caffeine 4.  Smoking cessation 5.  Headache diary 6.  Exercise 7.  Follow up in 3 months.  Thank you for allowing me to take part in the care of this patient.  Shon MilletAdam Jaffe, DO  CC:  Kelle DartingJeffrey Todd, MD

## 2014-09-17 ENCOUNTER — Telehealth: Payer: Self-pay | Admitting: *Deleted

## 2014-09-17 NOTE — Telephone Encounter (Signed)
Patient called stating she stop drinking caffeine and started her on a new medication she has had a headache for seven days straight. Call back number [phone]

## 2014-09-18 ENCOUNTER — Telehealth: Payer: Self-pay | Admitting: *Deleted

## 2014-09-18 NOTE — Telephone Encounter (Signed)
Error

## 2014-09-18 NOTE — Telephone Encounter (Signed)
Please advise 

## 2014-09-18 NOTE — Telephone Encounter (Signed)
Patient called stating she has had a headache for 8 days on and off she has stopped the caffeine completely. She states that the amitriptyline with the excedrin is helping but since she can only take 2 a week  She is asking is there something else she can take ? I explained to to her it most likely take at least the 4 weeks to see results from the medication .

## 2014-10-06 DIAGNOSIS — N926 Irregular menstruation, unspecified: Secondary | ICD-10-CM | POA: Insufficient documentation

## 2014-10-06 DIAGNOSIS — K259 Gastric ulcer, unspecified as acute or chronic, without hemorrhage or perforation: Secondary | ICD-10-CM

## 2014-10-06 DIAGNOSIS — E282 Polycystic ovarian syndrome: Secondary | ICD-10-CM | POA: Insufficient documentation

## 2014-10-06 DIAGNOSIS — N760 Acute vaginitis: Secondary | ICD-10-CM | POA: Insufficient documentation

## 2014-10-06 DIAGNOSIS — K219 Gastro-esophageal reflux disease without esophagitis: Secondary | ICD-10-CM

## 2014-10-06 HISTORY — DX: Gastro-esophageal reflux disease without esophagitis: K21.9

## 2014-10-06 HISTORY — DX: Polycystic ovarian syndrome: E28.2

## 2014-10-06 HISTORY — DX: Gastric ulcer, unspecified as acute or chronic, without hemorrhage or perforation: K25.9

## 2014-10-06 HISTORY — DX: Acute vaginitis: N76.0

## 2014-10-06 HISTORY — DX: Irregular menstruation, unspecified: N92.6

## 2014-10-08 DIAGNOSIS — IMO0001 Reserved for inherently not codable concepts without codable children: Secondary | ICD-10-CM | POA: Insufficient documentation

## 2014-10-08 DIAGNOSIS — N469 Male infertility, unspecified: Secondary | ICD-10-CM | POA: Insufficient documentation

## 2014-10-08 HISTORY — DX: Male infertility, unspecified: N46.9

## 2014-10-08 HISTORY — DX: Reserved for inherently not codable concepts without codable children: IMO0001

## 2014-12-02 ENCOUNTER — Ambulatory Visit (INDEPENDENT_AMBULATORY_CARE_PROVIDER_SITE_OTHER): Payer: Commercial Managed Care - PPO | Admitting: Family Medicine

## 2014-12-02 ENCOUNTER — Encounter: Payer: Self-pay | Admitting: Family Medicine

## 2014-12-02 VITALS — BP 110/82 | HR 78 | Temp 98.4°F | Ht 61.0 in | Wt 213.0 lb

## 2014-12-02 DIAGNOSIS — J0100 Acute maxillary sinusitis, unspecified: Secondary | ICD-10-CM

## 2014-12-02 MED ORDER — METHYLPREDNISOLONE 4 MG PO KIT: PACK | ORAL | Status: AC

## 2014-12-02 MED ORDER — AZITHROMYCIN 250 MG PO TABS: ORAL_TABLET | ORAL | Status: DC

## 2014-12-02 NOTE — Progress Notes (Signed)
Pre visit review using our clinic review tool, if applicable. No additional management support is needed unless otherwise documented below in the visit note. 

## 2014-12-03 ENCOUNTER — Telehealth: Payer: Self-pay | Admitting: Family Medicine

## 2014-12-03 ENCOUNTER — Encounter: Payer: Self-pay | Admitting: Family Medicine

## 2014-12-03 NOTE — Telephone Encounter (Signed)
emmi emailed °

## 2014-12-03 NOTE — Progress Notes (Signed)
   Subjective:    Patient ID: Renee Barrett, female    DOB: 1981/09/20, 33 y.o.   MRN: [id number]  HPI Here for 3 days of sinus pressure, PND, ST, and a dry cough. She has used Mucinex and Sudafed with no relief.    Review of Systems  Constitutional: Negative.   HENT: Positive for congestion, postnasal drip and sinus pressure.   Eyes: Negative.   Respiratory: Positive for cough.        Objective:   Physical Exam  Constitutional: She appears well-developed and well-nourished.  HENT:  Right Ear: External ear normal.  Left Ear: External ear normal.  Nose: Nose normal.  Mouth/Throat: Oropharynx is clear and moist.  Eyes: Conjunctivae are normal.  Pulmonary/Chest: Effort normal and breath sounds normal.  Lymphadenopathy:    She has no cervical adenopathy.          Assessment & Plan:  Recheck prn

## 2014-12-09 ENCOUNTER — Telehealth: Payer: Self-pay | Admitting: Family Medicine

## 2014-12-09 NOTE — Telephone Encounter (Signed)
Pt saw dr fry on 12/30 and states she is still having symptoms, ears stopped up, head stuffy, congestion, cough, not really much better.  Would like to know if she could get another round of antibiotics. Pt states she feels like the  zpac did nothing, not any good at all. [facility]

## 2014-12-09 NOTE — Telephone Encounter (Signed)
Call in Augmentin 875 bid for 10 days  

## 2014-12-10 ENCOUNTER — Encounter: Payer: Self-pay | Admitting: Neurology

## 2014-12-10 ENCOUNTER — Ambulatory Visit (INDEPENDENT_AMBULATORY_CARE_PROVIDER_SITE_OTHER): Payer: Commercial Managed Care - PPO | Admitting: Neurology

## 2014-12-10 VITALS — BP 128/70 | HR 76 | Temp 97.7°F | Resp 18 | Ht 61.2 in | Wt 212.2 lb

## 2014-12-10 DIAGNOSIS — G43709 Chronic migraine without aura, not intractable, without status migrainosus: Secondary | ICD-10-CM

## 2014-12-10 DIAGNOSIS — Z720 Tobacco use: Secondary | ICD-10-CM

## 2014-12-10 MED ORDER — AMITRIPTYLINE HCL 25 MG PO TABS: 25.0000 mg | ORAL_TABLET | Freq: Every day | ORAL | Status: DC

## 2014-12-10 MED ORDER — AMOXICILLIN-POT CLAVULANATE 875-125 MG PO TABS: 1.0000 | ORAL_TABLET | Freq: Two times a day (BID) | ORAL | Status: DC

## 2014-12-10 NOTE — Telephone Encounter (Signed)
I sent script e-scribe and left a voice message for pt. 

## 2014-12-10 NOTE — Progress Notes (Addendum)
NEUROLOGY FOLLOW UP OFFICE NOTE  Renee Highlandmanda Rindfleisch [id number]  HISTORY OF PRESENT ILLNESS: Renee Barrett is a 34 year old right-handed woman with history of panic attacks who follows up for migraine without aura.  UPDATE: She stopped caffeine for two weeks and headaches got worse.  After taking amitriptyline 10mg  for one month, there was no improvement. Intensity:  8-9/10 severe, 4-5/10 moderate Duration:  4-5 hours without excedrin (30-4260min with Excedrin) Frequency:  10-15 days/month  Current abortive therapy:  Excedrin Current preventative therapy:  none  Caffeine:  1 cup of coffee and 16-20 oz Dr. Reino KentPepper daily Alcohol:  no Smoker:  1 ppd (trying to quit) Diet:  Could be better Exercise:  Not lately because she does not feel well Depression/stress:  She has depression. Sleep hygiene:  Overall, it is good.  HISTORY: Onset:  Since 34 years old. Location:  Varies (right retro-orbital, center of forehead, back of head) Quality:  Pressure, throbbing, stabbing Intensity:  8-9/10 for severe, 4-5/10 moderate Aura:  no Prodrome:  no Associated symptoms:  Nausea, phonophobia.  No photophobia, visual symptoms or autonomic symptoms. Duration:  1 hour with Excedrin, otherwise 4hrs Frequency:  6 days per month (1 day per month for severe).  Not associated with her period. Triggers/exacerbating factors:  Caffeine withdraw, hunger Relieving factors:  Eating a big meal, caffeine, sleep Activity:  Cannot function for severe migraines  Past abortive therapy:  sumatriptan po 100mg  (flu-like symptoms), ibuprofen 600mg  (lost effectiveness), Excedrin (helped) Past preventative therapy:  None Other past medications:  Tried Zoloft for depression, but had side effects.  Family history of headache:  Mom, maternal grandmother  PAST MEDICAL HISTORY: Past Medical History  Diagnosis Date  . Tobacco abuse   . Headache     MEDICATIONS: Current Outpatient Prescriptions on File Prior to Visit    Medication Sig Dispense Refill  . acetaminophen (TYLENOL) 325 MG tablet Take 650 mg by mouth every 6 (six) hours as needed.    Marland Kitchen. azithromycin (ZITHROMAX) 250 MG tablet As directed (Patient not taking: Reported on 12/10/2014) 6 tablet 0  . omeprazole (PRILOSEC) 20 MG capsule Take 20 mg by mouth 2 (two) times daily.       No current facility-administered medications on file prior to visit.    ALLERGIES: No Known Allergies  FAMILY HISTORY: Family History  Problem Relation Age of Onset  . Thyroid disease Mother   . Diabetes Maternal Grandfather   . Cancer Paternal Grandmother     breast   . Rheum arthritis Mother     SOCIAL HISTORY: History   Social History  . Marital Status: Single    Spouse Name: N/A    Number of Children: N/A  . Years of Education: N/A   Occupational History  . Not on file.   Social History Main Topics  . Smoking status: Current Every Day Smoker -- 1.00 packs/day    Types: Cigarettes  . Smokeless tobacco: Never Used     Comment: pateint is aware this is unhealthy   . Alcohol Use: 0.0 oz/week    0 Not specified per week     Comment: occ  . Drug Use: No  . Sexual Activity:    Partners: Male    Birth Control/ Protection: None   Other Topics Concern  . Not on file   Social History Narrative    REVIEW OF SYSTEMS: Constitutional: No fevers, chills, or sweats, no generalized fatigue, change in appetite Eyes: No visual changes, double vision, eye pain Ear,  nose and throat: No hearing loss, ear pain, nasal congestion, sore throat Cardiovascular: No chest pain, palpitations Respiratory:  No shortness of breath at rest or with exertion, wheezes GastrointestinaI: No nausea, vomiting, diarrhea, abdominal pain, fecal incontinence Genitourinary:  No dysuria, urinary retention or frequency Musculoskeletal:  No neck pain, back pain Integumentary: No rash, pruritus, skin lesions Neurological: as above Psychiatric: No depression, insomnia,  anxiety Endocrine: No palpitations, fatigue, diaphoresis, mood swings, change in appetite, change in weight, increased thirst Hematologic/Lymphatic:  No anemia, purpura, petechiae. Allergic/Immunologic: no itchy/runny eyes, nasal congestion, recent allergic reactions, rashes  PHYSICAL EXAM: Filed Vitals:   12/10/14 0910  BP: 128/70  Pulse: 76  Temp: 97.7 F (36.5 C)  Resp: 18   General: No acute distress Head:  Normocephalic/atraumatic  IMPRESSION: Chronic migraine without aura Tobacco abuse  PLAN: 1.  Will retry amitriptyline at higher dose of  at bedtime.  She is to call in 4 weeks with update. 2.  Excedrin Migraine for abortive therapy 3.  Smoking cessation 4.  Advised to at least stop drinking Dr. Reino Kent 5.  Due to worsening headaches, will get MRI of brain with and without contrast. 6.  Follow up in 3 months.  15 minutes spent with patient, 100% spent discussing management.  Shon Millet, DO  CC: Kelle Darting, MD

## 2014-12-10 NOTE — Patient Instructions (Addendum)
1.  Started amitriptyline 25mg  at bedtime.  Keep headache diary and call in 4 weeks with update. 2.  Excedrin migraine for acute headache.  Limit to 2 days out of the week 3.  Stop Dr. Reino KentPepper. 4.  Exercise 5.  MRI [facility] brain with and without contrast for worsening headache [facility]  At 3:45pm  6.  Follow up in 3 months.

## 2014-12-16 ENCOUNTER — Telehealth: Payer: Self-pay | Admitting: *Deleted

## 2014-12-16 NOTE — Telephone Encounter (Signed)
Monday at 3:40 that her amitriptyline (ELAVIL) 25 MG tablet is to strong for her she has been taking for 6 days however she has stopped . I recieved NO message regarding this until  Her calling today  12/16/14 She is asking if we can prescribe a smaller dose ? Please advise

## 2014-12-16 NOTE — Telephone Encounter (Signed)
Left message for patient [facility] Dr [facility] may be cut in half and see if that works better  25 mg is the lowest dose it comes in or we can try something else  for the headaches  ask patient to return call

## 2014-12-16 NOTE — Telephone Encounter (Signed)
She can split in 1/2.  Doesn't come in smaller dose or we can change to pamelor (nortriptyline) which can sometimes be little more tolerable at same dose (for headache)

## 2014-12-23 ENCOUNTER — Ambulatory Visit (HOSPITAL_COMMUNITY)
Admission: RE | Admit: 2014-12-23 | Discharge: 2014-12-23 | Disposition: A | Discharge status: Home / Self Care | Payer: Commercial Managed Care - PPO | Source: Ambulatory Visit | Attending: Neurology | Admitting: Neurology

## 2014-12-23 DIAGNOSIS — R42 Dizziness and giddiness: Secondary | ICD-10-CM | POA: Diagnosis not present

## 2014-12-23 DIAGNOSIS — G43709 Chronic migraine without aura, not intractable, without status migrainosus: Secondary | ICD-10-CM

## 2014-12-23 DIAGNOSIS — J341 Cyst and mucocele of nose and nasal sinus: Secondary | ICD-10-CM | POA: Insufficient documentation

## 2014-12-23 DIAGNOSIS — Z720 Tobacco use: Secondary | ICD-10-CM

## 2014-12-23 DIAGNOSIS — R51 Headache: Secondary | ICD-10-CM | POA: Diagnosis not present

## 2014-12-23 MED ORDER — GADOBENATE DIMEGLUMINE 529 MG/ML IV SOLN
20.0000 mL | Freq: Once | INTRAVENOUS | Status: AC | PRN
  Administered 2014-12-23: 20 mL via INTRAVENOUS

## 2014-12-24 ENCOUNTER — Telehealth: Payer: Self-pay | Admitting: *Deleted

## 2014-12-24 NOTE — Telephone Encounter (Signed)
-----   Message from Cira ServantAdam Robert Jaffe, DO sent at 12/24/2014  7:08 AM EST ----- MRI unremarkable. ----- Message -----    From: Rad Results In Interface    Sent: 12/23/2014   4:52 PM      To: Cira ServantAdam Robert Jaffe, DO

## 2014-12-24 NOTE — Telephone Encounter (Signed)
Left message  for patient  normal MRI

## 2015-03-11 ENCOUNTER — Ambulatory Visit: Payer: Commercial Managed Care - PPO | Admitting: Neurology

## 2015-04-01 ENCOUNTER — Telehealth: Payer: Self-pay | Admitting: Family Medicine

## 2015-04-01 NOTE — Telephone Encounter (Signed)
Per Dr Tawanna Coolerodd patient should call her psychiatrist.  Left message on machine.

## 2015-04-01 NOTE — Telephone Encounter (Signed)
Pt is having anxiety issues again, has been going on for the last 3 days constant, over the last few months periodically.  Pt  needs appt today to get some relief for this.  Pt states she was supposed to get appt w/ psychiatrist but they were so booked out she did not schedule anything yet at all.

## 2015-04-02 ENCOUNTER — Telehealth: Payer: Self-pay | Admitting: Family Medicine

## 2015-04-02 NOTE — Telephone Encounter (Signed)
Pt would like new rx generic flexeril  [facility] [address]. Pt is having neck/shoulder  flare up. Pt said rx had expired it been over a year.

## 2015-04-05 NOTE — Telephone Encounter (Signed)
Pt is also asking for rx for DOXYCYCLINE for a finger infection. She said it should be in her file that she has this infection on and off

## 2015-04-05 NOTE — Telephone Encounter (Signed)
Left message on machine for patient to schedule an appointment per Dr Todd 

## 2015-07-06 DIAGNOSIS — I493 Ventricular premature depolarization: Secondary | ICD-10-CM

## 2015-07-06 DIAGNOSIS — R002 Palpitations: Secondary | ICD-10-CM

## 2015-07-06 HISTORY — DX: Ventricular premature depolarization: I49.3

## 2015-07-06 HISTORY — DX: Palpitations: R00.2

## 2016-02-04 ENCOUNTER — Ambulatory Visit (INDEPENDENT_AMBULATORY_CARE_PROVIDER_SITE_OTHER): Payer: Commercial Managed Care - PPO | Admitting: Family Medicine

## 2016-02-04 ENCOUNTER — Other Ambulatory Visit: Payer: Self-pay | Admitting: Obstetrics and Gynecology

## 2016-02-04 ENCOUNTER — Encounter: Payer: Self-pay | Admitting: Family Medicine

## 2016-02-04 VITALS — BP 106/83 | HR 77 | Temp 98.6°F | Ht 61.5 in | Wt 202.0 lb

## 2016-02-04 DIAGNOSIS — J209 Acute bronchitis, unspecified: Secondary | ICD-10-CM | POA: Diagnosis not present

## 2016-02-04 MED ORDER — HYDROCODONE-HOMATROPINE 5-1.5 MG/5ML PO SYRP: 5.0000 mL | ORAL_SOLUTION | ORAL | Status: DC | PRN

## 2016-02-04 MED ORDER — FLUCONAZOLE 150 MG PO TABS
150.0000 mg | ORAL_TABLET | Freq: Every day | ORAL | Status: DC
Start: 2016-02-04 — End: 2016-04-06

## 2016-02-04 MED ORDER — AZITHROMYCIN 250 MG PO TABS: ORAL_TABLET | ORAL | Status: DC

## 2016-02-04 NOTE — Progress Notes (Signed)
Pre visit review using our clinic review tool, if applicable. No additional management support is needed unless otherwise documented below in the visit note. 

## 2016-02-07 ENCOUNTER — Encounter: Payer: Self-pay | Admitting: Family Medicine

## 2016-02-07 NOTE — Progress Notes (Signed)
   Subjective:    Patient ID: Raynelle Highlandmanda Fahl, female    DOB: November 10, 1981, 35 y.o.   MRN: [id number]  HPI Here for 2 weeks of chest congestion and coughing up green sputum.    Review of Systems  Constitutional: Negative.   HENT: Positive for congestion and postnasal drip. Negative for ear pain, sinus pressure and sore throat.   Eyes: Negative.   Respiratory: Positive for cough and chest tightness. Negative for shortness of breath and wheezing.        Objective:   Physical Exam  Constitutional: She appears well-developed and well-nourished.  HENT:  Right Ear: External ear normal.  Left Ear: External ear normal.  Nose: Nose normal.  Mouth/Throat: Oropharynx is clear and moist.  Eyes: Conjunctivae are normal.  Neck: No thyromegaly present.  Pulmonary/Chest: Effort normal. No respiratory distress. She has no wheezes. She has no rales.  Scattered rhonchi  Lymphadenopathy:    She has no cervical adenopathy.          Assessment & Plan:  Bronchitis, treat with a Zpack.

## 2016-04-06 ENCOUNTER — Encounter: Payer: Self-pay | Admitting: Family Medicine

## 2016-04-06 ENCOUNTER — Ambulatory Visit (INDEPENDENT_AMBULATORY_CARE_PROVIDER_SITE_OTHER): Payer: Commercial Managed Care - PPO | Admitting: Family Medicine

## 2016-04-06 VITALS — BP 96/70 | HR 77 | Temp 97.6°F | Ht 61.5 in | Wt 205.5 lb

## 2016-04-06 DIAGNOSIS — F411 Generalized anxiety disorder: Secondary | ICD-10-CM

## 2016-04-06 DIAGNOSIS — G478 Other sleep disorders: Secondary | ICD-10-CM

## 2016-04-06 DIAGNOSIS — M25511 Pain in right shoulder: Secondary | ICD-10-CM | POA: Diagnosis not present

## 2016-04-06 NOTE — Progress Notes (Signed)
Pre visit review using our clinic review tool, if applicable. No additional management support is needed unless otherwise documented below in the visit note. 

## 2016-04-06 NOTE — Progress Notes (Signed)
HPI:  Renee Barrett is a pleasant 35 year old patient of Dr. Nelida Meuseodd's here for an acute visit for shoulder pain. She reports she has had intermittent right shoulder pain for several years after straining the shoulder. Usually the pain resolves on its own with some Flexeril and time. She pulled this muscle last week when she was plugging a lamp and behind the bed and was in an awkward position. She also feels she pulled some of her right chest muscles. She had gradual onset of pain in the Right muscle region and this worsened after sleeping wrong last night. She has not really tried anything for this yet. Denies radiation to the arms or hands, weakness, numbness, malaise, fevers or history of serious trauma to the neck. She also suffers from anxiety and reports she will be seeing a psychiatrist soon for this as her prior PCP to refuse to refill benzos for her. She has difficulty sleeping at night and wonders if there are safe treatments for this. She struggles sleeping due to anxiety. She was given a sleeping pill by her prior PCP, but this made her too tired. No psychosis or suicidal ideation. Occasional panic attacks in the past. She has not had counseling.  ROS: See pertinent positives and negatives per HPI.  Past Medical History  Diagnosis Date  . Tobacco abuse   . Headache     No past surgical history on file.  Family History  Problem Relation Age of Onset  . Thyroid disease Mother   . Diabetes Maternal Grandfather   . Cancer Paternal Grandmother     breast   . Rheum arthritis Mother     Social History   Social History  . Marital Status: Single    Spouse Name: N/A  . Number of Children: N/A  . Years of Education: N/A   Social History Main Topics  . Smoking status: Current Every Day Smoker -- 1.00 packs/day    Types: Cigarettes  . Smokeless tobacco: Never Used     Comment: pateint is aware this is unhealthy   . Alcohol Use: 0.0 oz/week    0 Standard drinks or equivalent per  week     Comment: occ  . Drug Use: No  . Sexual Activity:    Partners: Male    Birth Control/ Protection: None   Other Topics Concern  . None   Social History Narrative     Current outpatient prescriptions:  .  acetaminophen (TYLENOL) 325 MG tablet, Take 650 mg by mouth every 6 (six) hours as needed. Reported on 02/04/2016, Disp: , Rfl:   EXAM:  Filed Vitals:   04/06/16 1102  BP: 96/70  Pulse: 77  Temp: 97.6 F (36.4 C)    Body mass index is 38.2 kg/(m^2).  GENERAL: vitals reviewed and listed above, alert, oriented, appears well hydrated and in no acute distress  HEENT: atraumatic, conjunttiva clear, no obvious abnormalities on inspection of external nose and ears  NECK: no obvious masses on inspection  LUNGS: clear to auscultation bilaterally, no wheezes, rales or rhonchi, good air movement  CV: HRRR, no peripheral edema  MS: moves all extremities without noticeable abnormality; normal inspection of the neck shoulders arms and hands, normal range of motion in the head and neck, tenderness to palpation in the trapezius muscle on the right, and the pectoralis major muscle on the right, normal strength, sensitivity to light touch and DTRs in the bilateral upper extremities, negative Spurling, normal radial pulses.  PSYCH: pleasant and cooperative, no obvious  depression or anxiety  ASSESSMENT AND PLAN:  Discussed the following assessment and plan:  Right shoulder pain -Suspect muscle strain -She has Flexeril from her PCP at home for this, advised   could use at night for a few days -Home exercise program and as needed pain management with over-the-counter analgesics and topical sports creams and heat  -Follow up in 4 weeks, sooner if needed  Poor sleep pattern GAD (generalized anxiety disorder) -She is seeing psychiatry for this -Advised cognitive behavioral therapy would also be helpful - she is concerned about cost for this, did suggest some iPhone app's -Sleep  hygiene handout provided and advised a healthy diet and regular exercise  -Patient advised to return or notify a doctor immediately if symptoms worsen or persist or new concerns arise.  Patient Instructions  Before you leave: -Neck strain exercises -Schedule follow up with your doctor in about 1 month  Do the shoulder exercises provided 4 days per week. For pain you can use heat, Tylenol or Aleve per instructions on the bottle, topical sports creams with Cessation or menthol, heat injured Flexeril at night for a few days.  Follow-up with your psychiatrist about her anxiety and poor sleep. In the interim you can try the sleep hygiene recommendations below. There are several for cognitive behavioral therapy that may be helpful, iCBT, icouch and pacifica are a few. I think he would greatly benefit from cognitive behavioral therapy.  FOR IMPROVED SLEEP AND TO RESET YOUR SLEEP SCHEDULE:  exercise 30 minutes daily, you can find her target heart rate on the American Heart Association website.   Eat a healthy diet consisting of small meals, and do not eat for several hours before going to bed.   go to bed and wake up at the same time   keep bedroom cool, dark and quiet   reserve bed for sleep - do not read, watch TV, etc in bed   If you toss and turn more then 15-20 minutes get out of bed and list thoughts/do quiet activity then go back to bed; repeat as needed; do not worry about when you eventually fall asleep - still get up at the same time and turn on lights and take shower  get counseling   some people find that a half dose of benadryl, melatonin, tylenol pm or unisom on a few nights per week is helpful initially for a few weeks  seek help and treat any depression or anxiety  prescription strength sleep medications should only be used in severe cases of insomnia if other measures fail and should be used sparingly  Care immediately if her symptoms are worsening or  you have new concerns.     Kriste Basque R.

## 2016-04-06 NOTE — Patient Instructions (Addendum)
Before you leave: -Neck strain exercises -Schedule follow up with your doctor in about 1 month  Do the shoulder exercises provided 4 days per week. For pain you can use heat, Tylenol or Aleve per instructions on the bottle, topical sports creams with Cessation or menthol, heat injured Flexeril at night for a few days.  Follow-up with your psychiatrist about her anxiety and poor sleep. In the interim you can try the sleep hygiene recommendations below. There are several for cognitive behavioral therapy that may be helpful, iCBT, icouch and pacifica are a few. I think he would greatly benefit from cognitive behavioral therapy.  FOR IMPROVED SLEEP AND TO RESET YOUR SLEEP SCHEDULE: []  exercise 30 minutes daily, you can find her target heart rate on the American Heart Association website.  []  Eat a healthy diet consisting of small meals, and do not eat for several hours before going to bed.  []  go to bed and wake up at the same time  []  keep bedroom cool, dark and quiet  []  reserve bed for sleep - do not read, watch TV, etc in bed  []  If you toss and turn more then 15-20 minutes get out of bed and list thoughts/do quiet activity then go back to bed; repeat as needed; do not worry about when you eventually fall asleep - still get up at the same time and turn on lights and take shower  [] get counseling  []  some people find that a half dose of benadryl, melatonin, tylenol pm or unisom on a few nights per week is helpful initially for a few weeks  [] seek help and treat any depression or anxiety  [] prescription strength sleep medications should only be used in severe cases of insomnia if other measures fail and should be used sparingly  Care immediately if her symptoms are worsening or you have new concerns.

## 2018-04-24 DIAGNOSIS — F411 Generalized anxiety disorder: Secondary | ICD-10-CM | POA: Diagnosis not present

## 2018-05-09 ENCOUNTER — Encounter: Payer: Self-pay | Admitting: Family Medicine

## 2018-05-09 ENCOUNTER — Ambulatory Visit: Payer: BLUE CROSS/BLUE SHIELD | Admitting: Family Medicine

## 2018-05-09 VITALS — BP 98/72 | HR 102 | Temp 98.3°F | Ht 61.0 in | Wt 174.0 lb

## 2018-05-09 DIAGNOSIS — F419 Anxiety disorder, unspecified: Secondary | ICD-10-CM | POA: Diagnosis not present

## 2018-05-09 DIAGNOSIS — Z0000 Encounter for general adult medical examination without abnormal findings: Secondary | ICD-10-CM | POA: Diagnosis not present

## 2018-05-09 DIAGNOSIS — Z131 Encounter for screening for diabetes mellitus: Secondary | ICD-10-CM | POA: Diagnosis not present

## 2018-05-09 DIAGNOSIS — Z8669 Personal history of other diseases of the nervous system and sense organs: Secondary | ICD-10-CM | POA: Diagnosis not present

## 2018-05-09 DIAGNOSIS — F17210 Nicotine dependence, cigarettes, uncomplicated: Secondary | ICD-10-CM

## 2018-05-09 DIAGNOSIS — Z13220 Encounter for screening for lipoid disorders: Secondary | ICD-10-CM

## 2018-05-09 HISTORY — DX: Personal history of other diseases of the nervous system and sense organs: Z86.69

## 2018-05-09 HISTORY — DX: Nicotine dependence, cigarettes, uncomplicated: F17.210

## 2018-05-09 LAB — CBC WITH DIFFERENTIAL/PLATELET
BASOS ABS: 0 10*3/uL (ref 0.0–0.1)
BASOS PCT: 0.4 % (ref 0.0–3.0)
EOS ABS: 0.1 10*3/uL (ref 0.0–0.7)
Eosinophils Relative: 1.1 % (ref 0.0–5.0)
HEMATOCRIT: 43.3 % (ref 36.0–46.0)
HEMOGLOBIN: 14.9 g/dL (ref 12.0–15.0)
LYMPHS PCT: 23.8 % (ref 12.0–46.0)
Lymphs Abs: 1.7 10*3/uL (ref 0.7–4.0)
MCHC: 34.4 g/dL (ref 30.0–36.0)
MCV: 90 fl (ref 78.0–100.0)
MONO ABS: 0.6 10*3/uL (ref 0.1–1.0)
Monocytes Relative: 8.3 % (ref 3.0–12.0)
NEUTROS ABS: 4.8 10*3/uL (ref 1.4–7.7)
Neutrophils Relative %: 66.4 % (ref 43.0–77.0)
PLATELETS: 248 10*3/uL (ref 150.0–400.0)
RBC: 4.81 Mil/uL (ref 3.87–5.11)
RDW: 13.5 % (ref 11.5–15.5)
WBC: 7.2 10*3/uL (ref 4.0–10.5)

## 2018-05-09 LAB — LIPID PANEL
CHOLESTEROL: 182 mg/dL (ref 0–200)
HDL: 37.9 mg/dL — ABNORMAL LOW (ref >39.00)
LDL Cholesterol: 129 mg/dL — ABNORMAL HIGH (ref 0–99)
NonHDL: 143.79
TRIGLYCERIDES: 72 mg/dL (ref 0.0–149.0)
Total CHOL/HDL Ratio: 5
VLDL: 14.4 mg/dL (ref 0.0–40.0)

## 2018-05-09 LAB — COMPREHENSIVE METABOLIC PANEL
ALBUMIN: 4.1 g/dL (ref 3.5–5.2)
ALK PHOS: 66 U/L (ref 39–117)
ALT: 17 U/L (ref 0–35)
AST: 16 U/L (ref 0–37)
BUN: 12 mg/dL (ref 6–23)
CALCIUM: 9.4 mg/dL (ref 8.4–10.5)
CO2: 29 mEq/L (ref 19–32)
CREATININE: 0.67 mg/dL (ref 0.40–1.20)
Chloride: 102 mEq/L (ref 96–112)
GFR: 106.14 mL/min (ref >60.00)
Glucose, Bld: 83 mg/dL (ref 70–99)
Potassium: 4.6 mEq/L (ref 3.5–5.1)
SODIUM: 139 meq/L (ref 135–145)
Total Bilirubin: 0.4 mg/dL (ref 0.2–1.2)
Total Protein: 6.4 g/dL (ref 6.0–8.3)

## 2018-05-09 LAB — HEMOGLOBIN A1C: Hgb A1c MFr Bld: 5.2 % (ref 4.6–6.5)

## 2018-05-09 NOTE — Patient Instructions (Signed)
Preventive Care 18-39 Years, Female Preventive care refers to lifestyle choices and visits with your health care provider that can promote health and wellness. What does preventive care include?  A yearly physical exam. This is also called an annual well check.  Dental exams once or twice a year.  Routine eye exams. Ask your health care provider how often you should have your eyes checked.  Personal lifestyle choices, including: ? Daily care of your teeth and gums. ? Regular physical activity. ? Eating a healthy diet. ? Avoiding tobacco and drug use. ? Limiting alcohol use. ? Practicing safe sex. ? Taking vitamin and mineral supplements as recommended by your health care provider. What happens during an annual well check? The services and screenings done by your health care provider during your annual well check will depend on your age, overall health, lifestyle risk factors, and family history of disease. Counseling Your health care provider may ask you questions about your:  Alcohol use.  Tobacco use.  Drug use.  Emotional well-being.  Home and relationship well-being.  Sexual activity.  Eating habits.  Work and work Statistician.  Method of birth control.  Menstrual cycle.  Pregnancy history.  Screening You may have the following tests or measurements:  Height, weight, and BMI.  Diabetes screening. This is done by checking your blood sugar (glucose) after you have not eaten for a while (fasting).  Blood pressure.  Lipid and cholesterol levels. These may be checked every 5 years starting at age 66.  Skin check.  Hepatitis C blood test.  Hepatitis B blood test.  Sexually transmitted disease (STD) testing.  BRCA-related cancer screening. This may be done if you have a family history of breast, ovarian, tubal, or peritoneal cancers.  Pelvic exam and Pap test. This may be done every 3 years starting at age 40. Starting at age 59, this may be done every 5  years if you have a Pap test in combination with an HPV test.  Discuss your test results, treatment options, and if necessary, the need for more tests with your health care provider. Vaccines Your health care provider may recommend certain vaccines, such as:  Influenza vaccine. This is recommended every year.  Tetanus, diphtheria, and acellular pertussis (Tdap, Td) vaccine. You may need a Td booster every 10 years.  Varicella vaccine. You may need this if you have not been vaccinated.  HPV vaccine. If you are 69 or younger, you may need three doses over 6 months.  Measles, mumps, and rubella (MMR) vaccine. You may need at least one dose of MMR. You may also need a second dose.  Pneumococcal 13-valent conjugate (PCV13) vaccine. You may need this if you have certain conditions and were not previously vaccinated.  Pneumococcal polysaccharide (PPSV23) vaccine. You may need one or two doses if you smoke cigarettes or if you have certain conditions.  Meningococcal vaccine. One dose is recommended if you are age 27-21 years and a first-year college student living in a residence hall, or if you have one of several medical conditions. You may also need additional booster doses.  Hepatitis A vaccine. You may need this if you have certain conditions or if you travel or work in places where you may be exposed to hepatitis A.  Hepatitis B vaccine. You may need this if you have certain conditions or if you travel or work in places where you may be exposed to hepatitis B.  Haemophilus influenzae type b (Hib) vaccine. You may need this if  you have certain risk factors.  Talk to your health care provider about which screenings and vaccines you need and how often you need them. This information is not intended to replace advice given to you by your health care provider. Make sure you discuss any questions you have with your health care provider. Document Released: 01/16/2002 Document Revised: 08/09/2016  Document Reviewed: 09/21/2015 Elsevier Interactive Patient Education  [employer].

## 2018-05-09 NOTE — Progress Notes (Signed)
Subjective:     Renee Barrett is a 37 y.o. female and is here for a comprehensive physical exam. The patient reports no problems.  Pt currently working as a Aeronautical engineercustomer service manager.  Pt is doing the keto diet.  She has lost 30 lbs.    Anxiety: -followed by Dr. Lafayette Dragonarr or Kauer? -taking Xanax every night  Migraines: -may have a HA a few times per month. -takes Excedrin migraine -drinking two 20 oz bottles of water per day. -has allergy to Imitrex  Tobacco use: -smoking 1/2 ppd -was smoking 1 ppd. -currently doing a 12 wk plan to stop smoking.  Social History   Socioeconomic History  . Marital status: Single    Spouse name: Not on file  . Number of children: Not on file  . Years of education: Not on file  . Highest education level: Not on file  Occupational History  . Not on file  Social Needs  . Financial resource strain: Not on file  . Food insecurity:    Worry: Not on file    Inability: Not on file  . Transportation needs:    Medical: Not on file    Non-medical: Not on file  Tobacco Use  . Smoking status: Current Every Day Smoker    Packs/day: 1.00    Types: Cigarettes  . Smokeless tobacco: Never Used  . Tobacco comment: pateint is aware this is unhealthy   Substance and Sexual Activity  . Alcohol use: Yes    Alcohol/week: 0.0 oz    Comment: occ  . Drug use: No  . Sexual activity: Yes    Partners: Male    Birth control/protection: None  Lifestyle  . Physical activity:    Days per week: Not on file    Minutes per session: Not on file  . Stress: Not on file  Relationships  . Social connections:    Talks on phone: Not on file    Gets together: Not on file    Attends religious service: Not on file    Active member of club or organization: Not on file    Attends meetings of clubs or organizations: Not on file    Relationship status: Not on file  . Intimate partner violence:    Fear of current or ex partner: Not on file    Emotionally abused: Not on file     Physically abused: Not on file    Forced sexual activity: Not on file  Other Topics Concern  . Not on file  Social History Narrative  . Not on file   Health Maintenance  Topic Date Due  . HIV Screening  11/01/1997  . TETANUS/TDAP  11/01/2001  . INFLUENZA VACCINE  07/04/2018  . PAP SMEAR  06/01/2020    The following portions of the patient's history were reviewed and updated as appropriate: allergies, current medications, past family history, past medical history, past social history, past surgical history and problem list.  Review of Systems A comprehensive review of systems was negative.   Objective:    BP 98/72 (BP Location: Left Arm, Patient Position: Sitting, Cuff Size: Large)   Pulse (!) 102   Temp 98.3 F (36.8 C) (Oral)   Ht 5\' 1"  (1.549 m)   Wt 174 lb (78.9 kg)   LMP 04/18/2018 (Exact Date)   SpO2 96%   BMI 32.88 kg/m  General appearance: alert, cooperative and no distress Head: Normocephalic, without obvious abnormality, atraumatic Eyes: conjunctivae/corneas clear. PERRL, EOM's intact. Fundi benign. Ears:  normal TM's and external ear canals both ears Nose: Nares normal. Septum midline. Mucosa normal. No drainage or sinus tenderness. Throat: lips, mucosa, and tongue normal; teeth and gums normal Neck: no adenopathy, no JVD, supple, symmetrical, trachea midline and thyroid not enlarged, symmetric, no tenderness/mass/nodules Lungs: clear to auscultation bilaterally Heart: regular rate and rhythm, S1, S2 normal, no murmur, click, rub or gallop Abdomen: soft, non-tender; bowel sounds normal; no masses,  no organomegaly Extremities: extremities normal, atraumatic, no cyanosis or edema Skin: Skin color, texture, turgor normal. No rashes or lesions Neurologic: Alert and oriented X 3, normal strength and tone. Normal symmetric reflexes. Normal coordination and gait    Assessment:    Healthy female exam.      Plan:      Anticipatory guidance given including  wearing seatbelts, smoke detectors in the home, increasing physical activity, increasing p.o. intake of water and vegetables. -will obtain labs this visit -pap up to date as followed by OB/Gyn -handout given See After Visit Summary for Counseling Recommendations    Anxiety -continue current meds -continue f/u with Psychiatry  -Of note Psychiatry is prescribing pt's Xanax  Migraines -continue Excedrin migraine prn -encouraged to stay hydrated, get plenty of rest and decrease stress.  Nicotine dependence -Smoking cessation counseling greater than 3 minutes, less than 10 minutes -Currently smoking 1/2 pack/day -Ready to quit. -Patient encouraged to continue 12 with plan to help her quit smoking. -We will reassess at each office visit.  Follow-up PRN  Abbe Amsterdam, MD

## 2018-05-14 DIAGNOSIS — Z6833 Body mass index (BMI) 33.0-33.9, adult: Secondary | ICD-10-CM | POA: Diagnosis not present

## 2018-05-14 DIAGNOSIS — R102 Pelvic and perineal pain: Secondary | ICD-10-CM | POA: Diagnosis not present

## 2018-07-16 ENCOUNTER — Ambulatory Visit: Payer: BLUE CROSS/BLUE SHIELD | Admitting: Family Medicine

## 2018-07-16 ENCOUNTER — Encounter: Payer: Self-pay | Admitting: Family Medicine

## 2018-07-16 VITALS — BP 120/80 | HR 80 | Temp 98.1°F | Wt 166.1 lb

## 2018-07-16 DIAGNOSIS — L03012 Cellulitis of left finger: Secondary | ICD-10-CM | POA: Diagnosis not present

## 2018-07-16 MED ORDER — CEPHALEXIN 500 MG PO CAPS: 500.0000 mg | ORAL_CAPSULE | Freq: Four times a day (QID) | ORAL | 0 refills | Status: DC

## 2018-07-16 MED ORDER — FLUCONAZOLE 150 MG PO TABS: 150.0000 mg | ORAL_TABLET | Freq: Once | ORAL | 0 refills | Status: AC

## 2018-07-16 NOTE — Progress Notes (Signed)
  Subjective:     Patient ID: Renee Barrett, female   DOB: Mar 24, 1981, 37 y.o.   MRN: [id number]  HPI Patient seen with some redness and soreness left index finger mostly volar surface. She gives interesting history that about 10 years ago she had a manicure and ended up with subsequent infection left index finger. She states that since that time she's had multiple recurrences of erythema with associated pain. She has seen multiple physicians and she states the only thing that seems to help with acute flares is antibiotics. She's been treated with doxycycline and Keflex in the past and both have worked.  She went to dermatologist once and had a biopsy which was inconclusive. She was prescribed Valtrex for some time but she's never seen blisters and she declined taking this regularly. No history of Raynaud's disease. She recently quit smoking. Denies any fevers or chills.  She states a couple of occasions she had sounds like a small abscess that was drained. No history of felon.  Past Medical History:  Diagnosis Date  . Anxiety   . Headache   . Insomnia   . Tobacco abuse    No past surgical history on file.  reports that she has been smoking cigarettes. She has been smoking about 1.00 pack per day. She has never used smokeless tobacco. She reports that she drinks alcohol. She reports that she does not use drugs. family history includes Cancer in her paternal grandmother; Diabetes in her maternal grandfather; Rheum arthritis in her mother; Thyroid disease in her mother. Allergies  Allergen Reactions  . Hydrocodone Itching  . Imitrex [Sumatriptan]      Review of Systems  Constitutional: Negative for chills and fever.       Objective:   Physical Exam  Constitutional: She appears well-developed and well-nourished.  Cardiovascular: Normal rate and regular rhythm.  Pulmonary/Chest: Effort normal and breath sounds normal.  Skin:  Left index finger reveals some mild erythema which  blanches with pressure along the distal tip of the index finger. No necrosis. No warmth. Mildly tender. No paronychia. No fluctuance.. No ulceration.       Assessment:     History of recurrent erythema and tenderness left index finger.  Etiology unclear. No history of herpes and no history of vesiculation    Plan:     -Started Keflex 500 mg 4 times a day for 10 days which has worked for her in the past. -Follow-up for any persistent or worsening symptoms  Kristian CoveyBruce W Denney Shein MD [facility]

## 2018-07-16 NOTE — Patient Instructions (Signed)
Let us know if redness not resolving over the next couple of weeks.

## 2018-08-02 ENCOUNTER — Telehealth: Payer: Self-pay | Admitting: Family Medicine

## 2018-08-02 NOTE — Telephone Encounter (Unsigned)
Copied from CRM [id number]. Topic: Inquiry >> Aug 02, 2018 11:04 AM Maia Pettiesrtiz, Kristie S wrote: Reason for CRM: pt calling stating she faxed a form to be completed that is required by her insurance stating she completed her physical this year. Pt saw Dr. Salomon FickBanks on 05/09/18. She states it was faxed in 07/10/18 and 07/26/18. Pt states the cover letter stated what needed to be done (to complete and fax back including name and DOB as was requested). Pt would like call with status update.

## 2018-09-17 ENCOUNTER — Telehealth: Payer: Self-pay

## 2018-09-17 NOTE — Telephone Encounter (Signed)
Pt Routine Preventative Care form has been completed and faxed to the fax number provided on the form, confirmation received.

## 2018-09-23 ENCOUNTER — Telehealth: Payer: Self-pay | Admitting: Family Medicine

## 2018-09-23 NOTE — Telephone Encounter (Signed)
Copied from CRM [id number]. Topic: Quick Communication - See Telephone Encounter >> Sep 23, 2018  9:25 AM Floria Raveling A wrote: CRM for notification. See Telephone encounter for: 09/23/18.  Pt called in and stated that her finger has flared up again and would like to know if the Doxycycline and valtrex could be called in and again for her without her coming.  She stated she needs these med today or she will probably have to go and have it lanced again?  Please advise.  She stated that Dr Caryl Never would know about what was going on.    Best number -[phone] Pharmacy - [facility] [address]

## 2018-09-24 NOTE — Telephone Encounter (Signed)
Patient is calling and states this needs to go to Dr. Caryl Never and is requesting this be refilled as soon as possible.   [phone]

## 2018-09-26 NOTE — Telephone Encounter (Signed)
Pt was seen by Dr Caryl Never for this issue, offered to schedule her an appointment to see Dr Salomon Fick but insists on seeing Dr Caryl Never since he treated he for this issue the first time. Please Advise.

## 2018-09-26 NOTE — Telephone Encounter (Signed)
Pt calling back and states Dr Caryl Never saw her for this issue and she is not happy it was not sent to Dr Caryl Never.  Pt states she has also seen Dr Tawanna Cooler for this issue and both of them prescribed Valtrex. Pt declined appt with Dr Salomon Fick, stating Dr Caryl Never advised her to call him back if she had any problems. Pt states she has only seen Dr Salomon Fick one time and not for this issue. Pt aware Dr Caryl Never not in the office today, and she is going to call back in the am to see if he has addressed.

## 2018-09-26 NOTE — Telephone Encounter (Signed)
Called pt left a message to call the office and schedule an OV with Dr Salomon Fick regarding the Flare up of her finger

## 2018-09-27 ENCOUNTER — Other Ambulatory Visit: Payer: Self-pay

## 2018-09-27 ENCOUNTER — Encounter: Payer: Self-pay | Admitting: Family Medicine

## 2018-09-27 ENCOUNTER — Ambulatory Visit: Payer: BLUE CROSS/BLUE SHIELD | Admitting: Family Medicine

## 2018-09-27 VITALS — BP 128/88 | HR 92 | Temp 97.6°F | Ht 61.0 in | Wt 156.8 lb

## 2018-09-27 DIAGNOSIS — B0089 Other herpesviral infection: Secondary | ICD-10-CM

## 2018-09-27 MED ORDER — DOXYCYCLINE HYCLATE 100 MG PO TABS: 100.0000 mg | ORAL_TABLET | Freq: Two times a day (BID) | ORAL | 0 refills | Status: DC

## 2018-09-27 MED ORDER — VALACYCLOVIR HCL 500 MG PO TABS: 500.0000 mg | ORAL_TABLET | Freq: Every day | ORAL | 1 refills | Status: DC

## 2018-09-27 MED ORDER — DOXYCYCLINE HYCLATE 100 MG PO CAPS: 100.0000 mg | ORAL_CAPSULE | Freq: Two times a day (BID) | ORAL | 0 refills | Status: DC

## 2018-09-27 NOTE — Telephone Encounter (Signed)
Noted.  I reviewed prior office visit.  She did not have any visible vesicles at that visit.  We can send in one refill of Doxycycline 100 mg po bid for 10 days- but she will need to see primary for future recurrences.

## 2018-09-27 NOTE — Telephone Encounter (Signed)
Rx for Doxycycline 100 mg has been sent to the pharmacy, pt is aware to see provider in future. Thank you

## 2018-09-27 NOTE — Progress Notes (Signed)
  Subjective:     Patient ID: Renee Barrett, female   DOB: 07/27/1981, 37 y.o.   MRN: [id number]  HPI Patient called earlier requesting refill of antibiotic and Valtrex for what has been a recurrent issue of what sounds like herpetic whitlow left index finger.  She started having a small blister few days ago.  She has noted in the past sometimes simple trauma can incite her rash.  This is vesicular and painful.    She apparently had culture years ago which grew out herpes.  She has had some diffuse erythema and has been treated with antibiotics concomitantly in the past.  She is requesting refill of Valtrex.  She requested seeing me today because she states that her primary has not been aware of this problem to date.  We had seen her for similar occurrence recently.  Past Medical History:  Diagnosis Date  . Anxiety   . Headache   . Insomnia   . Tobacco abuse    History reviewed. No pertinent surgical history.  reports that she quit smoking about 5 weeks ago. Her smoking use included cigarettes. She smoked 1.00 pack per day. She has never used smokeless tobacco. She reports that she drinks alcohol. She reports that she does not use drugs. family history includes Cancer in her paternal grandmother; Diabetes in her maternal grandfather; Rheum arthritis in her mother; Thyroid disease in her mother. Allergies  Allergen Reactions  . Hydrocodone Itching  . Imitrex [Sumatriptan]      Review of Systems  Constitutional: Negative for chills and fever.       Objective:   Physical Exam  Constitutional: She appears well-developed and well-nourished.  Cardiovascular: Normal rate and regular rhythm.  Skin:  Left index finger distal volar surface she has what appears to be very small vesicle about 1 mm diameter.  Only very minimal surrounding erythema.  No fluctuance.  No pustules.       Assessment:     Recurrent herpetic whitlow involving left index finger    Plan:     -Refill of  Valtrex 500 mg daily -Follow-up promptly for any signs of secondary infection -Follow up with her primary for recurrences.  Kristian Covey MD [facility]

## 2018-10-21 DIAGNOSIS — F411 Generalized anxiety disorder: Secondary | ICD-10-CM | POA: Diagnosis not present

## 2018-10-21 DIAGNOSIS — F4322 Adjustment disorder with anxiety: Secondary | ICD-10-CM | POA: Diagnosis not present

## 2018-10-21 DIAGNOSIS — F410 Panic disorder [episodic paroxysmal anxiety] without agoraphobia: Secondary | ICD-10-CM | POA: Diagnosis not present

## 2019-01-07 ENCOUNTER — Ambulatory Visit: Payer: BLUE CROSS/BLUE SHIELD | Admitting: Family Medicine

## 2019-01-07 ENCOUNTER — Encounter: Payer: Self-pay | Admitting: Family Medicine

## 2019-01-07 VITALS — BP 122/72 | HR 100 | Temp 99.1°F | Resp 12 | Ht 61.0 in | Wt 164.4 lb

## 2019-01-07 DIAGNOSIS — N926 Irregular menstruation, unspecified: Secondary | ICD-10-CM

## 2019-01-07 DIAGNOSIS — R0981 Nasal congestion: Secondary | ICD-10-CM | POA: Diagnosis not present

## 2019-01-07 DIAGNOSIS — R070 Pain in throat: Secondary | ICD-10-CM | POA: Diagnosis not present

## 2019-01-07 DIAGNOSIS — L290 Pruritus ani: Secondary | ICD-10-CM

## 2019-01-07 MED ORDER — ALBENDAZOLE 200 MG PO TABS: 400.0000 mg | ORAL_TABLET | Freq: Once | ORAL | 0 refills | Status: AC

## 2019-01-07 MED ORDER — PREDNISONE 20 MG PO TABS: 40.0000 mg | ORAL_TABLET | Freq: Every day | ORAL | 0 refills | Status: AC

## 2019-01-07 MED ORDER — FLUTICASONE PROPIONATE 50 MCG/ACT NA SUSP: 1.0000 | Freq: Two times a day (BID) | NASAL | 2 refills | Status: DC

## 2019-01-07 MED ORDER — NYSTATIN-TRIAMCINOLONE 100000-0.1 UNIT/GM-% EX CREA: 1.0000 "application " | TOPICAL_CREAM | Freq: Two times a day (BID) | CUTANEOUS | 0 refills | Status: DC | PRN

## 2019-01-07 NOTE — Patient Instructions (Signed)
[name] I have seen you today for an acute visit.  A few things to remember from today's visit:   Missed menses - Plan: POCT urine pregnancy  Nasal congestion - Plan: predniSONE (DELTASONE) 20 MG tablet, fluticasone (FLONASE) 50 MCG/ACT nasal spray  Anal pruritus - Plan: nystatin-triamcinolone (MYCOLOG II) cream   If medications prescribed today, they will not be refill upon request, a follow up appointment with PCP will be necessary to discuss continuation of of treatment if appropriate.    Anal Pruritus Anal pruritus is when the area around the opening of the butt (anus) feels itchy. This itchy feeling often happens when the area is moist. Moisture may come from sweat or from a small amount of poop (stool) that is left on the area. Many other things can also cause the itching. These include:  Perfumed soaps and sprays.  Colored or scented toilet paper.  Washing the area too much.  Eating certain foods.  Having watery poop (diarrhea).  Certain skin conditions or other medical conditions. The itching often goes away with home care. Scratching can damage the skin and make the problem worse. Follow these instructions at home: Skin care      Keep clean by washing your body well. ? Clean the affected area with wet toilet paper or a wet washcloth. Do this after you poop and at bedtime. ? Avoid using soaps on this area. ? Dry the area well. Pat the area dry with toilet paper or a towel.  Do not scrub the area with anything, even toilet paper.  Do not scratch the itchy area. Scratching makes the itching worse.  Take a warm water bath (sitz bath) as told by your doctor. ? A sitz bath is a warm water bath that only comes up to your hips and covers your butt. A sitz bath may done at home in a bathtub. It may also be done with a portable sitz bath that fits over the toilet. ? Pat the area dry with a soft cloth after each bath.  Use creams or ointments as told by  your doctor.  Do not use bubble bath, scented toilet paper, or genital deodorants. General instructions  Watch for any changes in your symptoms.  Use over-the-counter and prescription medicines only as told by your doctor.  Avoid using medicines that help you poop (laxatives).  Talk with your doctor about increasing the fiber in your diet. This can help keep your poop normal if you often have loose poop.  Limit or avoid foods that may cause your symptoms. These foods may include: ? Spicy foods, such as salsa, jalapeo peppers, and spicy seasonings. ? Foods with caffeine in them. ? Beer. ? Milk products. ? Chocolate. ? Nuts. ? Citrus fruits. ? Tomatoes.  Wear cotton underwear and loose clothing.  Keep all follow-up visits as told by your doctor. This is important. Contact a doctor if:  Your itching does not get better after a few days.  Your itching gets worse.  You have a fever.  You have redness, swelling, or pain in the itchy area.  You have fluid, blood, or pus coming from the itchy area. Summary  Anal pruritus is when the area around the opening of the butt (anus) feels itchy.  Use over-the-counter and prescription medicines only as told by your doctor.  Keep clean by washing your body well as told by your doctor.  Talk with your doctor about taking fiber pills (supplements). This information is not intended to  replace advice given to you by your health care provider. Make sure you discuss any questions you have with your health care provider. Document Released: 08/02/2011 Document Revised: 04/01/2018 Document Reviewed: 04/01/2018 Elsevier Interactive Patient Education  2019 [employer]   In general please monitor for signs of worsening symptoms and seek immediate medical attention if any concerning.  If symptoms are not resolved in 2 weeks you should schedule a follow up appointment with your doctor, before if needed.  I hope you get better soon!

## 2019-01-07 NOTE — Progress Notes (Signed)
ACUTE VISIT  HPI:  Chief Complaint  Patient presents with  . Headache    sx started to get worse within the last week  . Nasal Congestion    [name] is a 38 y.o.female here today complaining of 5-7 days of respiratory symptoms.  Nasal congestion,rhinorrhea,post nasal drainage,and burning throat sensation intermittent for a month but seems to be worse for 5 days.  Subjective "slight" fever but no chills. Frontal pressure headache in the morning.  Negative for dysphagia,chest discomfort,heartburn,or acid reflux.  Sore Throat   This is a recurrent problem. The current episode started more than 1 month ago. The problem has been waxing and waning. The pain is mild. Associated symptoms include congestion, headaches and a plugged ear sensation. Pertinent negatives include no abdominal pain, coughing, diarrhea, ear discharge, ear pain, hoarse voice, neck pain, shortness of breath, stridor, swollen glands, trouble swallowing or vomiting. She has had no exposure to strep or mono. The treatment provided mild relief.     No Hx of recent travel. No sick contact. No known insect bite.  Hx of allergies: Negative.  OTC medications for this problem: Coughing drops, but she has not had cough.   She is also c/o 1-2 months of intermittent perianal pruritus,worse at night. Her dog was recently treated for "round worm".  "Deep itching",no rash but has noted skin changes due to frequent scratching. She has not identified exacerbating or alleviating factors.  Negative for abdominal pain,nasuea,vomiting,changes in bowel habits,or rectal bleed.  She has not tried OTC medications.   Review of Systems  Constitutional: Positive for activity change, fatigue and fever. Negative for appetite change.  HENT: Positive for congestion, postnasal drip, rhinorrhea, sinus pressure and sore throat. Negative for ear discharge, ear pain, hoarse voice, mouth sores, trouble swallowing and  voice change.   Eyes: Negative for discharge and redness.  Respiratory: Negative for cough, shortness of breath, wheezing and stridor.   Gastrointestinal: Negative for abdominal pain, diarrhea, nausea, rectal pain and vomiting.  Musculoskeletal: Negative for gait problem, myalgias and neck pain.  Skin: Negative for rash.  Allergic/Immunologic: Negative for environmental allergies.  Neurological: Positive for headaches. Negative for weakness and numbness.  Hematological: Negative for adenopathy. Does not bruise/bleed easily.  Psychiatric/Behavioral: Negative for confusion. The patient is nervous/anxious.      Current Outpatient Medications on File Prior to Visit  Medication Sig Dispense Refill  . ALPRAZolam (XANAX) 1 MG tablet Take 1 mg by mouth at bedtime.    . cyclobenzaprine (FLEXERIL) 10 MG tablet     . doxycycline (VIBRAMYCIN) 100 MG capsule Take 1 capsule (100 mg total) by mouth 2 (two) times daily. (Patient taking differently: Take 100 mg by mouth as needed. ) 20 capsule 0  . valACYclovir (VALTREX) 500 MG tablet Take 1 tablet (500 mg total) by mouth daily. (Patient taking differently: Take 500 mg by mouth as needed. ) 90 tablet 1   No current facility-administered medications on file prior to visit.      Past Medical History:  Diagnosis Date  . Anxiety   . Headache   . Insomnia   . Tobacco abuse    Allergies  Allergen Reactions  . Hydrocodone Itching  . Imitrex [Sumatriptan]     Social History   Socioeconomic History  . Marital status: Single    Spouse name: Not on file  . Number of children: Not on file  . Years of education: Not on file  . Highest education level:  Not on file  Occupational History  . Not on file  Social Needs  . Financial resource strain: Not on file  . Food insecurity:    Worry: Not on file    Inability: Not on file  . Transportation needs:    Medical: Not on file    Non-medical: Not on file  Tobacco Use  . Smoking status: Former  Smoker    Packs/day: 1.00    Types: Cigarettes    Last attempt to quit: 08/18/2018    Years since quitting: 0.4  . Smokeless tobacco: Never Used  . Tobacco comment: pateint is aware this is unhealthy   Substance and Sexual Activity  . Alcohol use: Yes    Alcohol/week: 0.0 standard drinks    Comment: occ  . Drug use: No  . Sexual activity: Yes    Partners: Male    Birth control/protection: None  Lifestyle  . Physical activity:    Days per week: Not on file    Minutes per session: Not on file  . Stress: Not on file  Relationships  . Social connections:    Talks on phone: Not on file    Gets together: Not on file    Attends religious service: Not on file    Active member of club or organization: Not on file    Attends meetings of clubs or organizations: Not on file    Relationship status: Not on file  Other Topics Concern  . Not on file  Social History Narrative  . Not on file    Vitals:   01/07/19 0903  BP: 122/72  Pulse: 100  Resp: 12  Temp: 99.1 F (37.3 C)  SpO2: 99%   Body mass index is 31.06 kg/m.   Physical Exam  Nursing note and vitals reviewed. Constitutional: She is oriented to person, place, and time. She appears well-developed. She does not appear ill. No distress.  HENT:  Head: Normocephalic and atraumatic.  Right Ear: Tympanic membrane, external ear and ear canal normal.  Left Ear: Tympanic membrane, external ear and ear canal normal.  Nose: Rhinorrhea present. Right sinus exhibits no maxillary sinus tenderness and no frontal sinus tenderness. Left sinus exhibits no maxillary sinus tenderness and no frontal sinus tenderness.  Mouth/Throat: Oropharynx is clear and moist and mucous membranes are normal.  Nasal voice. Postnasal drainage.  Eyes: Conjunctivae are normal.  Cardiovascular: Normal rate and regular rhythm.  No murmur heard. Respiratory: Effort normal and breath sounds normal. No respiratory distress.  Genitourinary: Rectum:     No  rectal mass or tenderness.     Genitourinary Comments: Signs of scratching with hyperpigmentation changes. No erythema,edema,or tenderness perianally.   Lymphadenopathy:    She has no cervical adenopathy.  Neurological: She is alert and oriented to person, place, and time. She has normal strength.  Skin: Skin is warm. No rash noted. No erythema.  Psychiatric: Her mood appears anxious.  Well groomed, good eye contact.      ASSESSMENT AND PLAN:  Renee Barrett was seen today for headache and nasal congestion.  Diagnoses and all orders for this visit:  Nasal sinus congestion ? Allergic rhinitis. Short course of Prednisone may help.  Continue with intranasal steroid ,Flonase. Nasal irrigations with saline. OTC antihistaminic may also help. F?u with PCP if needed.  -     predniSONE (DELTASONE) 20 MG tablet; Take 2 tablets (40 mg total) by mouth daily with breakfast for 3 days. -     fluticasone (FLONASE) 50 MCG/ACT  nasal spray; Place 1 spray into both nostrils 2 (two) times daily.  Anal pruritus Possible etiologies discussed. ?Dermatitis,residual stool,and infectious among some. Concerned about parasites, pinworm, she would like to be treated empirically. Side effects of medication discussed. Adequate hygiene.  -     nystatin-triamcinolone (MYCOLOG II) cream; Apply 1 application topically 2 (two) times daily as needed.  Missed menses -     POCT urine pregnancy  Throat discomfort Possible etiologies discussed. Because it has been going on for at least a month,I do not think rapid strep is needed today. ? Allergies,GERD among some to consider. Instructed about warning signs. F/U with PCP as needed.  Other orders -     albendazole (ALBENZA) 200 MG tablet; Take 2 tablets (400 mg total) by mouth once for 1 dose.  She was supposed to stop at the lab to have pregnancy test. LMP 4 weeks ago.   Return if symptoms worsen or fail to improve.      Betty G. SwazilandJordan,  MD  [facility]. [place] office.

## 2019-01-11 ENCOUNTER — Encounter: Payer: Self-pay | Admitting: Family Medicine

## 2019-04-08 ENCOUNTER — Telehealth: Payer: Self-pay | Admitting: *Deleted

## 2019-04-08 DIAGNOSIS — B373 Candidiasis of vulva and vagina: Secondary | ICD-10-CM | POA: Diagnosis not present

## 2019-04-08 NOTE — Telephone Encounter (Signed)
Copied from CRM [id number]. Topic: General - Inquiry >> Apr 08, 2019 10:55 AM [name] wrote: Reason for CRM: Pt has been experiencing vaginal itching for the past 4-5 days. She has spent a lot of time in a hot tub. Pt is requesting rx for Diflucan because she thinks she may have a yeast infection. Pt advised virtual appt may be needed first. Please advise. CB#[phone]  [id number] [place], [place] - [address] [phone] (Phone) [phone] (Fax)

## 2019-04-08 NOTE — Telephone Encounter (Signed)
Pt scheduled an e visit later today for the issue, advised pt to call office if she needs dr Salomon Fick to see her virtually.

## 2019-04-09 ENCOUNTER — Telehealth: Payer: Self-pay | Admitting: *Deleted

## 2019-04-09 NOTE — Telephone Encounter (Signed)
Spoke with pt states that she had a virtual visit on 04/08/2019 at 10 pm and she received medication for her problem

## 2019-04-09 NOTE — Telephone Encounter (Signed)
Copied from CRM [id number]. Topic: General - Inquiry >> Apr 08, 2019 10:55 AM [name] wrote: Reason for CRM: Pt has been experiencing vaginal itching for the past 4-5 days. She has spent a lot of time in a hot tub. Pt is requesting rx for Diflucan because she thinks she may have a yeast infection. Pt advised virtual appt may be needed first. Please advise. CB#[phone]  [id number] [place], [place] - [address] [phone] (Phone) [phone] (Fax) >> Apr 09, 2019  9:06 AM Fanny Bien wrote: Pt states that she did a virtual visit with her insurance. FYI

## 2019-05-27 DIAGNOSIS — F411 Generalized anxiety disorder: Secondary | ICD-10-CM | POA: Diagnosis not present

## 2019-05-27 DIAGNOSIS — F410 Panic disorder [episodic paroxysmal anxiety] without agoraphobia: Secondary | ICD-10-CM | POA: Diagnosis not present

## 2019-06-05 DIAGNOSIS — L309 Dermatitis, unspecified: Secondary | ICD-10-CM | POA: Diagnosis not present

## 2019-06-05 DIAGNOSIS — L814 Other melanin hyperpigmentation: Secondary | ICD-10-CM | POA: Diagnosis not present

## 2019-06-05 DIAGNOSIS — L980 Pyogenic granuloma: Secondary | ICD-10-CM | POA: Diagnosis not present

## 2019-06-09 ENCOUNTER — Telehealth: Payer: Self-pay | Admitting: Family Medicine

## 2019-06-09 NOTE — Telephone Encounter (Signed)
Patient is requesting MS screening.  She is requesting a call back to make an in office appt.  She is not wanting a virtual appt.

## 2019-06-10 NOTE — Telephone Encounter (Signed)
Pt is scheduled for OV  06/25/2019

## 2019-06-13 NOTE — Telephone Encounter (Signed)
Pt is schedule to come in on 06/25/2019

## 2019-06-25 ENCOUNTER — Encounter: Payer: Self-pay | Admitting: Family Medicine

## 2019-06-25 ENCOUNTER — Ambulatory Visit: Payer: BC Managed Care – PPO | Admitting: Family Medicine

## 2019-06-25 ENCOUNTER — Other Ambulatory Visit: Payer: Self-pay

## 2019-06-25 VITALS — BP 120/78 | HR 72 | Temp 98.9°F | Wt 179.0 lb

## 2019-06-25 DIAGNOSIS — Z0001 Encounter for general adult medical examination with abnormal findings: Secondary | ICD-10-CM | POA: Diagnosis not present

## 2019-06-25 DIAGNOSIS — R519 Headache, unspecified: Secondary | ICD-10-CM

## 2019-06-25 DIAGNOSIS — R41840 Attention and concentration deficit: Secondary | ICD-10-CM

## 2019-06-25 DIAGNOSIS — R3915 Urgency of urination: Secondary | ICD-10-CM

## 2019-06-25 DIAGNOSIS — R51 Headache: Secondary | ICD-10-CM | POA: Diagnosis not present

## 2019-06-25 DIAGNOSIS — Z820 Family history of epilepsy and other diseases of the nervous system: Secondary | ICD-10-CM | POA: Diagnosis not present

## 2019-06-25 DIAGNOSIS — Z0000 Encounter for general adult medical examination without abnormal findings: Secondary | ICD-10-CM

## 2019-06-25 DIAGNOSIS — E785 Hyperlipidemia, unspecified: Secondary | ICD-10-CM

## 2019-06-25 MED ORDER — KETOROLAC TROMETHAMINE 60 MG/2ML IM SOLN
60.0000 mg | Freq: Once | INTRAMUSCULAR | Status: AC
  Administered 2019-06-25: 60 mg via INTRAMUSCULAR

## 2019-06-25 NOTE — Progress Notes (Signed)
Subjective:     Mari Battaglia is a 38 y.o. female and is here for a comprehensive physical exam. The patient reports problems - HA, mental "fogginess", urinary urgency.  Pt states she developed a HA on 7/5 which has been ongoing.  Pain is in the occipital area and around the side of her head to the L temporal area.  Pt taking Ibuprofen and excedrin.  Pt states this HA is different from her previous migraines.  Pt was seen by Neurology in the past and had negative MRI brain in 2016.  Pt also notes decreased focus and mental fogginess off and on x 5-6 months.  Pt also with increased urinary urgency x 3 months.  Pt concerned she has MS as her sister was dx'd in Nov.  No other family h/o MS.  Pap done 03/01/17.  Followed by OB/Gyn  Social History   Socioeconomic History  . Marital status: Single    Spouse name: Not on file  . Number of children: Not on file  . Years of education: Not on file  . Highest education level: Not on file  Occupational History  . Not on file  Social Needs  . Financial resource strain: Not on file  . Food insecurity    Worry: Not on file    Inability: Not on file  . Transportation needs    Medical: Not on file    Non-medical: Not on file  Tobacco Use  . Smoking status: Former Smoker    Packs/day: 1.00    Types: Cigarettes    Quit date: 08/18/2018    Years since quitting: 0.8  . Smokeless tobacco: Never Used  . Tobacco comment: pateint is aware this is unhealthy   Substance and Sexual Activity  . Alcohol use: Yes    Alcohol/week: 0.0 standard drinks    Comment: occ  . Drug use: No  . Sexual activity: Yes    Partners: Male    Birth control/protection: None  Lifestyle  . Physical activity    Days per week: Not on file    Minutes per session: Not on file  . Stress: Not on file  Relationships  . Social Herbalist on phone: Not on file    Gets together: Not on file    Attends religious service: Not on file    Active member of club or  organization: Not on file    Attends meetings of clubs or organizations: Not on file    Relationship status: Not on file  . Intimate partner violence    Fear of current or ex partner: Not on file    Emotionally abused: Not on file    Physically abused: Not on file    Forced sexual activity: Not on file  Other Topics Concern  . Not on file  Social History Narrative  . Not on file   Health Maintenance  Topic Date Due  . HIV Screening  11/01/1997  . TETANUS/TDAP  11/01/2001  . INFLUENZA VACCINE  07/05/2019  . PAP SMEAR-Modifier  06/01/2020    The following portions of the patient's history were reviewed and updated as appropriate: allergies, current medications, past family history, past medical history, past social history, past surgical history and problem list.  Review of Systems Pertinent items noted in HPI and remainder of comprehensive ROS otherwise negative.   Objective:    BP 120/78 (BP Location: Left Arm, Patient Position: Sitting, Cuff Size: Normal)   Pulse 72   Temp 98.9  F (37.2 C) (Oral)   Wt 179 lb (81.2 kg)   LMP 05/24/2019   SpO2 98%   BMI 33.82 kg/m  General appearance: alert, cooperative and no distress Head: Normocephalic, without obvious abnormality, atraumatic Eyes: conjunctivae/corneas clear. PERRL, EOM's intact. Fundi benign. Ears: normal TM's and external ear canals both ears Nose: Nares normal. Septum midline. Mucosa normal. No drainage or sinus tenderness. Throat: lips, mucosa, and tongue normal; teeth and gums normal Neck: no adenopathy, no carotid bruit, no JVD, supple, symmetrical, trachea midline and thyroid not enlarged, symmetric, no tenderness/mass/nodules Lungs: clear to auscultation bilaterally Heart: regular rate and rhythm, S1, S2 normal, no murmur, click, rub or gallop Abdomen: soft, non-tender; bowel sounds normal; no masses,  no organomegaly Extremities: extremities normal, atraumatic, no cyanosis or edema Pulses: 2+ and  symmetric Skin: Skin color, texture, turgor normal. No rashes or lesions Lymph nodes: Cervical, supraclavicular, and axillary nodes normal. Neurologic: Alert and oriented X 3, normal strength and tone. Normal symmetric reflexes. Normal coordination and gait Mental status: Alert, oriented, thought content appropriate, alertness: alert Cranial nerves: II: visual field normal, II: pupils equal, round, reactive to light and accommodation, III,VII: ptosis absent, III,IV,VI: extraocular muscles extra-ocular motions intact, V: mastication normal, VII: upper facial muscle function normal bilaterally, VII: lower facial muscle function normal bilaterally, VIII: hearing normal, XI: trapezius strength normal bilaterally, XI: sternocleidomastoid strength normal bilaterally, XI: neck flexion strength normal, XII: tongue strength normal  Sensory: normal Motor: grossly normal Reflexes: 2+ and symmetric Coordination: normal    Assessment:    Healthy female exam with ongoing HA     Plan:     Anticipatory guidance given including wearing seatbelts, smoke detectors in the home, increasing physical activity, increasing p.o. intake of water and vegetables. -will obtain labs -pap up to date followed by OB/Gyn.  Due 2021 if not done with HPV testing, 2023 if had HPV testing -given handouts See After Visit Summary for Counseling Recommendations    Family history of MS (multiple sclerosis)  -given continued HA, urinary frequency, and decreased mental focus will proceed with MRI brain  Urinary urgency  - Plan: POCT urinalysis dipstick  Poor concentration - Plan: TSH, T4, Free, Vitamin B12, Vitamin D, 25-hydroxy  Hyperlipidemia, unspecified hyperlipidemia type  - Plan: Lipid panel  Chronic intractable headache, unspecified headache type -will give toardol IM for HA  -discussed HA prevention - Plan: CBC (no diff), Basic metabolic panel, MR Brain W Wo Contrast, ketorolac (TORADOL) injection 60 mg -given  precautions  F/u prn  More than 50% of over 30 minutes spent in total spent face-to-face with the patient, counseling and/or coordinating care.    Abbe AmsterdamShannon Bennett Ram, MD

## 2019-06-25 NOTE — Patient Instructions (Signed)
Preventive Care 21-37 Years Old, Female Preventive care refers to visits with your health care provider and lifestyle choices that can promote health and wellness. This includes:  A yearly physical exam. This may also be called an annual well check.  Regular dental visits and eye exams.  Immunizations.  Screening for certain conditions.  Healthy lifestyle choices, such as eating a healthy diet, getting regular exercise, not using drugs or products that contain nicotine and tobacco, and limiting alcohol use. What can I expect for my preventive care visit? Physical exam Your health care provider will check your:  Height and weight. This may be used to calculate body mass index (BMI), which tells if you are at a healthy weight.  Heart rate and blood pressure.  Skin for abnormal spots. Counseling Your health care provider may ask you questions about your:  Alcohol, tobacco, and drug use.  Emotional well-being.  Home and relationship well-being.  Sexual activity.  Eating habits.  Work and work environment.  Method of birth control.  Menstrual cycle.  Pregnancy history. What immunizations do I need?  Influenza (flu) vaccine  This is recommended every year. Tetanus, diphtheria, and pertussis (Tdap) vaccine  You may need a Td booster every 10 years. Varicella (chickenpox) vaccine  You may need this if you have not been vaccinated. Human papillomavirus (HPV) vaccine  If recommended by your health care provider, you may need three doses over 6 months. Measles, mumps, and rubella (MMR) vaccine  You may need at least one dose of MMR. You may also need a second dose. Meningococcal conjugate (MenACWY) vaccine  One dose is recommended if you are age 19-21 years and a first-year college student living in a residence hall, or if you have one of several medical conditions. You may also need additional booster doses. Pneumococcal conjugate (PCV13) vaccine  You may need  this if you have certain conditions and were not previously vaccinated. Pneumococcal polysaccharide (PPSV23) vaccine  You may need one or two doses if you smoke cigarettes or if you have certain conditions. Hepatitis A vaccine  You may need this if you have certain conditions or if you travel or work in places where you may be exposed to hepatitis A. Hepatitis B vaccine  You may need this if you have certain conditions or if you travel or work in places where you may be exposed to hepatitis B. Haemophilus influenzae type b (Hib) vaccine  You may need this if you have certain conditions. You may receive vaccines as individual doses or as more than one vaccine together in one shot (combination vaccines). Talk with your health care provider about the risks and benefits of combination vaccines. What tests do I need?  Blood tests  Lipid and cholesterol levels. These may be checked every 5 years starting at age 20.  Hepatitis C test.  Hepatitis B test. Screening  Diabetes screening. This is done by checking your blood sugar (glucose) after you have not eaten for a while (fasting).  Sexually transmitted disease (STD) testing.  BRCA-related cancer screening. This may be done if you have a family history of breast, ovarian, tubal, or peritoneal cancers.  Pelvic exam and Pap test. This may be done every 3 years starting at age 21. Starting at age 30, this may be done every 5 years if you have a Pap test in combination with an HPV test. Talk with your health care provider about your test results, treatment options, and if necessary, the need for more tests.   Follow these instructions at home: Eating and drinking   Eat a diet that includes fresh fruits and vegetables, whole grains, lean protein, and low-fat dairy.  Take vitamin and mineral supplements as recommended by your health care provider.  Do not drink alcohol if: ? Your health care provider tells you not to drink. ? You are  pregnant, may be pregnant, or are planning to become pregnant.  If you drink alcohol: ? Limit how much you have to 0-1 drink a day. ? Be aware of how much alcohol is in your drink. In the [place] one drink equals one 12 oz bottle of beer (355 mL), one 5 oz glass of wine (148 mL), or one 1 oz glass of hard liquor (44 mL). Lifestyle  Take daily care of your teeth and gums.  Stay active. Exercise for at least 30 minutes on 5 or more days each week.  Do not use any products that contain nicotine or tobacco, such as cigarettes, e-cigarettes, and chewing tobacco. If you need help quitting, ask your health care provider.  If you are sexually active, practice safe sex. Use a condom or other form of birth control (contraception) in order to prevent pregnancy and STIs (sexually transmitted infections). If you plan to become pregnant, see your health care provider for a preconception visit. What's next?  Visit your health care provider once a year for a well check visit.  Ask your health care provider how often you should have your eyes and teeth checked.  Stay up to date on all vaccines. This information is not intended to replace advice given to you by your health care provider. Make sure you discuss any questions you have with your health care provider. Document Released: 01/16/2002 Document Revised: 08/01/2018 Document Reviewed: 08/01/2018 Elsevier Patient Education  [place].  Analgesic Rebound Headache An analgesic rebound headache, sometimes called a medication overuse headache, is a headache that comes after pain medicine (analgesic) taken to treat the original (primary) headache has worn off. Any type of primary headache can return as a rebound headache if a person regularly takes analgesics more than three times a week to treat it. The types of primary headaches that are commonly associated with rebound headaches include:  Migraines.  Headaches that arise from tense muscles in  the head and neck area (tension headaches).  Headaches that develop and happen again (recur) on one side of the head and around the eye (cluster headaches). If rebound headaches continue, they become chronic daily headaches. What are the causes? This condition may be caused by frequent use of:  Over-the-counter medicines such as aspirin, ibuprofen, and acetaminophen.  Sinus relief medicines and other medicines that contain caffeine.  Narcotic pain medicines such as codeine and oxycodone. What are the signs or symptoms? The symptoms of a rebound headache are the same as the symptoms of the original headache. Some of the symptoms of specific types of headaches include: Migraine headache  Pulsing or throbbing pain on one or both sides of the head.  Severe pain that interferes with daily activities.  Pain that is worsened by physical activity.  Nausea, vomiting, or both.  Pain with exposure to bright light, loud noises, or strong smells.  General sensitivity to bright light, loud noises, or strong smells.  Visual changes.  Numbness of one or both arms. Tension headache  Pressure around the head.  Dull, aching head pain.  Pain felt over the front and sides of the head.  Tenderness in the muscles  of the head, neck, and shoulders. Cluster headache  Severe pain that begins in or around one eye or temple.  Redness and tearing in the eye on the same side as the pain.  Droopy or swollen eyelid.  One-sided head pain.  Nausea.  Runny nose.  Sweaty, pale facial skin.  Restlessness. How is this diagnosed? This condition is diagnosed by:  Reviewing your medical history. This includes the nature of your primary headaches.  Reviewing the types of pain medicines that you have been using to treat your headaches and how often you take them. How is this treated? This condition may be treated or managed by:  Discontinuing frequent use of the analgesic medicine. Doing this  may worsen your headaches at first, but the pain should eventually become more manageable, less frequent, and less severe.  Seeing a headache specialist. He or she may be able to help you manage your headaches and help make sure there is not another cause of the headaches.  Using methods of stress relief, such as acupuncture, counseling, biofeedback, and massage. Talk with your health care provider about which methods might be good for you. Follow these instructions at home:  Take over-the-counter and prescription medicines only as told by your health care provider.  Stop the repeated use of pain medicine as told by your health care provider. Stopping can be difficult. Carefully follow instructions from your health care provider.  Avoid triggers that are known to cause your primary headaches.  Keep all follow-up visits as told by your health care provider. This is important. Contact a health care provider if:  You continue to experience headaches after following treatments that your health care provider recommended. Get help right away if:  You develop new headache pain.  You develop headache pain that is different than what you have experienced in the past.  You develop numbness or tingling in your arms or legs.  You develop changes in your speech or vision. This information is not intended to replace advice given to you by your health care provider. Make sure you discuss any questions you have with your health care provider. Document Released: 02/10/2004 Document Revised: 11/02/2017 Document Reviewed: 04/24/2016 Elsevier Patient Education  2020 [employer].

## 2019-06-26 LAB — TSH: TSH: 1.36 u[IU]/mL (ref 0.35–4.50)

## 2019-06-26 LAB — BASIC METABOLIC PANEL
BUN: 18 mg/dL (ref 6–23)
CO2: 26 mEq/L (ref 19–32)
Calcium: 9.2 mg/dL (ref 8.4–10.5)
Chloride: 105 mEq/L (ref 96–112)
Creatinine, Ser: 1.01 mg/dL (ref 0.40–1.20)
GFR: 61.79 mL/min (ref >60.00)
Glucose, Bld: 77 mg/dL (ref 70–99)
Potassium: 3.8 mEq/L (ref 3.5–5.1)
Sodium: 139 mEq/L (ref 135–145)

## 2019-06-26 LAB — CBC
HCT: 40 % (ref 36.0–46.0)
Hemoglobin: 13.5 g/dL (ref 12.0–15.0)
MCHC: 33.7 g/dL (ref 30.0–36.0)
MCV: 89.6 fl (ref 78.0–100.0)
Platelets: 248 10*3/uL (ref 150.0–400.0)
RBC: 4.47 Mil/uL (ref 3.87–5.11)
RDW: 12.9 % (ref 11.5–15.5)
WBC: 7.7 10*3/uL (ref 4.0–10.5)

## 2019-06-26 LAB — VITAMIN D 25 HYDROXY (VIT D DEFICIENCY, FRACTURES): VITD: 33.72 ng/mL (ref 30.00–100.00)

## 2019-06-26 LAB — LIPID PANEL
Cholesterol: 164 mg/dL (ref 0–200)
HDL: 60.9 mg/dL (ref >39.00)
LDL Cholesterol: 86 mg/dL (ref 0–99)
NonHDL: 102.96
Total CHOL/HDL Ratio: 3
Triglycerides: 87 mg/dL (ref 0.0–149.0)
VLDL: 17.4 mg/dL (ref 0.0–40.0)

## 2019-06-26 LAB — T4, FREE: Free T4: 0.85 ng/dL (ref 0.60–1.60)

## 2019-06-26 LAB — VITAMIN B12: Vitamin B-12: 859 pg/mL (ref 211–911)

## 2019-06-27 ENCOUNTER — Encounter: Payer: Self-pay | Admitting: Family Medicine

## 2019-07-02 ENCOUNTER — Telehealth: Payer: Self-pay

## 2019-07-02 NOTE — Telephone Encounter (Signed)
Copied from [place] [id number]. Topic: Referral - Request for Referral >> Jul 02, 2019 12:25 PM Marin Olp L wrote: Has patient seen PCP for this complaint? Yes.   *If NO, is insurance requiring patient see PCP for this issue before PCP can refer them? Referral for which specialty: Orthopaedics Preferred provider/office: Dr. recommendation Reason for referral: head pain, thinks its a pulled muscle Please call patient once placed

## 2019-07-02 NOTE — Telephone Encounter (Signed)
Spoke with patient. Patient reports she is on day 24 of headache. Please advise

## 2019-07-04 ENCOUNTER — Other Ambulatory Visit: Payer: Self-pay | Admitting: Family Medicine

## 2019-07-04 DIAGNOSIS — G8929 Other chronic pain: Secondary | ICD-10-CM

## 2019-07-04 DIAGNOSIS — R519 Headache, unspecified: Secondary | ICD-10-CM

## 2019-07-04 NOTE — Telephone Encounter (Signed)
Referral to Neurology placed.

## 2019-07-08 NOTE — Telephone Encounter (Signed)
Understand pt concerned HA likely from muscle strain.  Given the duration pt would still likely benefit more from [facility] referral as opposed to Ortho.

## 2019-07-08 NOTE — Telephone Encounter (Signed)
Okay for ortho referral

## 2019-07-10 ENCOUNTER — Encounter: Payer: Self-pay | Admitting: Neurology

## 2019-07-10 ENCOUNTER — Other Ambulatory Visit: Payer: Self-pay

## 2019-07-10 ENCOUNTER — Ambulatory Visit: Payer: BC Managed Care – PPO | Admitting: Neurology

## 2019-07-10 VITALS — BP 114/74 | HR 68 | Temp 98.0°F | Ht 61.0 in | Wt 178.5 lb

## 2019-07-10 DIAGNOSIS — R51 Headache: Secondary | ICD-10-CM

## 2019-07-10 DIAGNOSIS — R519 Headache, unspecified: Secondary | ICD-10-CM

## 2019-07-10 HISTORY — DX: Headache, unspecified: R51.9

## 2019-07-10 MED ORDER — RIZATRIPTAN BENZOATE 5 MG PO TBDP: 5.0000 mg | ORAL_TABLET | ORAL | 12 refills | Status: DC | PRN

## 2019-07-10 MED ORDER — NORTRIPTYLINE HCL 25 MG PO CAPS: 50.0000 mg | ORAL_CAPSULE | Freq: Every day | ORAL | 11 refills | Status: DC

## 2019-07-10 NOTE — Telephone Encounter (Signed)
Per Neuro note pt with occipital neuralgia, given trigger point injection.  HAs also thought to have some migraine features started on several medications and advised to f/u in 2 months with Neuro.

## 2019-07-10 NOTE — Progress Notes (Signed)
     History: Persistent left occipital area daily headache.  Left occipital nerve block   Patient's left occipital area was palpated, the location of greater occipital nerve was identified by drawing a line from external occipital protuberance to mastoid process,    Greater Occipital Nerve lies 2/3 along the line, closer to the occiput Lesser Occipital Nerve lies 1/3 along the line, closer to the mastoid process  3.0 cc of mixture, 1.5 cc of 0.5% bupivacaine mixed with 1.5 cc of dexamethasone.  Injection site lies 2 cm lateral and 2 cm inferior to the external occipital protuberance. Injection was also performed at the maximum tenderness point  The patient tolerated the injections well, no complications of the procedure were noted. Injections were made with a 27-gauge needle.  Marcial Pacas, M.D. Ph.D.  [facility] [place], [place] [address] Phone: [phone] Fax:      [phone]

## 2019-07-10 NOTE — Telephone Encounter (Signed)
Patient reports she went to the neurologist today. Per patient the neurologist feels it muscular. Dr. Volanda Napoleon advise

## 2019-07-10 NOTE — Progress Notes (Signed)
PATIENT: Renee Barrett Chaviano DOB: 02-Oct-1981  Chief Complaint  Patient presents with  . New Patient (Initial Visit)     internal Referral  for chronic intractable headache room in back hallway     HISTORICAL  Renee Barrett Trella 38 year old female, seen in request by her primary care physician Dr. Abbe AmsterdamBanks, Shannon R evaluation of persistent left-sided headache, initial evaluation was on July 10, 2019.  I have reviewed and summarized the referring note from the referring physician.  She had occasionally headache in the past, happen frequently in 2017, has much improved, and clear triggers, in June 2020, she began to have daily headaches, this has been persistent over the past 32 days per patient, is always on the left side, starting from occipital region, spreading forward to left parietal region, pressure plus sharp radiating pain, getting worse at the end of the day, she noticed gradual buildup of intracranial pressure, she denies light, noise sensitivity, Her sister was recently diagnosed with multiple sclerosis, she has MRI brain pending  Laboratory evaluation showed normal vitamin D, B12, TSH, lipid panel, CBC,  REVIEW OF SYSTEMS: Full 14 system review of systems performed and notable only for as above All other review of systems were negative.  ALLERGIES: Allergies  Allergen Reactions  . Hydrocodone Itching  . Imitrex [Sumatriptan]     HOME MEDICATIONS: Current Outpatient Medications  Medication Sig Dispense Refill  . ALPRAZolam (XANAX) 1 MG tablet Take 1 mg by mouth at bedtime.    . cyclobenzaprine (FLEXERIL) 10 MG tablet     . valACYclovir (VALTREX) 500 MG tablet Take 1 tablet (500 mg total) by mouth daily. (Patient taking differently: Take 500 mg by mouth as needed. ) 90 tablet 1   No current facility-administered medications for this visit.     PAST MEDICAL HISTORY: Past Medical History:  Diagnosis Date  . Anxiety   . Headache   . Insomnia   . Tobacco abuse     PAST  SURGICAL HISTORY: No past surgical history on file.  FAMILY HISTORY: Family History  Problem Relation Age of Onset  . Thyroid disease Mother   . Rheum arthritis Mother   . Diabetes Maternal Grandfather   . Cancer Paternal Grandmother        breast     SOCIAL HISTORY: Social History   Socioeconomic History  . Marital status: Single    Spouse name: Not on file  . Number of children: Not on file  . Years of education: Not on file  . Highest education level: Not on file  Occupational History  . Not on file  Social Needs  . Financial resource strain: Not on file  . Food insecurity    Worry: Not on file    Inability: Not on file  . Transportation needs    Medical: Not on file    Non-medical: Not on file  Tobacco Use  . Smoking status: Former Smoker    Packs/day: 1.00    Types: Cigarettes    Quit date: 08/18/2018    Years since quitting: 0.8  . Smokeless tobacco: Never Used  . Tobacco comment: pateint is aware this is unhealthy   Substance and Sexual Activity  . Alcohol use: Yes    Alcohol/week: 0.0 standard drinks    Comment: occ  . Drug use: No  . Sexual activity: Yes    Partners: Male    Birth control/protection: None  Lifestyle  . Physical activity    Days per week: Not on file  Minutes per session: Not on file  . Stress: Not on file  Relationships  . Social Musicianconnections    Talks on phone: Not on file    Gets together: Not on file    Attends religious service: Not on file    Active member of club or organization: Not on file    Attends meetings of clubs or organizations: Not on file    Relationship status: Not on file  . Intimate partner violence    Fear of current or ex partner: Not on file    Emotionally abused: Not on file    Physically abused: Not on file    Forced sexual activity: Not on file  Other Topics Concern  . Not on file  Social History Narrative  . Not on file     PHYSICAL EXAM   Vitals:   07/10/19 1019  BP: 114/74  Pulse: 68   Temp: 98 F (36.7 C)  Weight: 178 lb 8 oz (81 kg)  Height: 5\' 1"  (1.549 m)    Not recorded      Body mass index is 33.73 kg/m.  PHYSICAL EXAMNIATION:  Gen: NAD, conversant, well nourised, obese, well groomed                     Cardiovascular: Regular rate rhythm, no peripheral edema, warm, nontender. Eyes: Conjunctivae clear without exudates or hemorrhage Neck: Supple, no carotid bruits. Pulmonary: Clear to auscultation bilaterally   NEUROLOGICAL EXAM:  MENTAL STATUS: Speech:    Speech is normal; fluent and spontaneous with normal comprehension.  Cognition:     Orientation to time, place and person     Normal recent and remote memory     Normal Attention span and concentration     Normal Language, naming, repeating,spontaneous speech     Fund of knowledge   CRANIAL NERVES: CN II: Visual fields are full to confrontation.  Pupils are round equal and briskly reactive to light. CN III, IV, VI: extraocular movement are normal. No ptosis. CN V: Facial sensation is intact to pinprick in all 3 divisions bilaterally. Corneal responses are intact.  CN VII: Face is symmetric with normal eye closure and smile. CN VIII: Hearing is normal to rubbing fingers CN IX, X: Palate elevates symmetrically. Phonation is normal. CN XI: Head turning and shoulder shrug are intact CN XII: Tongue is midline with normal movements and no atrophy.  MOTOR: There is no pronator drift of out-stretched arms. Muscle bulk and tone are normal. Muscle strength is normal.  REFLEXES: Reflexes are 2+ and symmetric at the biceps, triceps, knees, and ankles. Plantar responses are flexor.  SENSORY: Intact to light touch, pinprick, positional sensation and vibratory sensation are intact in fingers and toes.  COORDINATION: Rapid alternating movements and fine finger movements are intact. There is no dysmetria on finger-to-nose and heel-knee-shin.    GAIT/STANCE: Posture is normal. Gait is steady with normal  steps, base, arm swing, and turning. Heel and toe walking are normal. Tandem gait is normal.  Romberg is absent.   DIAGNOSTIC DATA (LABS, IMAGING, TESTING) - I reviewed patient records, labs, notes, testing and imaging myself where available.   ASSESSMENT AND PLAN  Renee Barrett Lormand is a 38 y.o. female    Left side head  Significant tenderness upon left nuchal line, radiating pain, suggestive of left occipital neuralgia  I performed trigger point injection today, please see separate note  Nortriptyline 25 mg, titrating to 2 tablets every night as preventive medications  Headache does have some migraine features, will give her maxalt as needed, may mix together with Aleve, muscle relaxant, also emphasized importance of heating pad, neck stretching exercise  Marcial Pacas, M.D. Ph.D.  [facility] [address], [place], [place] [address] Ph: [phone] Fax: [phone]  CC: Billie Ruddy, MD

## 2019-07-11 ENCOUNTER — Encounter: Payer: Self-pay | Admitting: Family Medicine

## 2019-07-11 NOTE — Telephone Encounter (Signed)
Spoke with patient. Informed patient of Dr. Volanda Napoleon message so we will not be referring to Ortho. Patient was not happy and said she will be transferring to [facility].

## 2019-07-29 ENCOUNTER — Other Ambulatory Visit: Payer: Self-pay

## 2019-07-29 ENCOUNTER — Ambulatory Visit
Admission: RE | Admit: 2019-07-29 | Discharge: 2019-07-29 | Disposition: A | Discharge status: Home / Self Care | Payer: BC Managed Care – PPO | Source: Ambulatory Visit | Attending: Family Medicine | Admitting: Family Medicine

## 2019-07-29 DIAGNOSIS — R51 Headache: Secondary | ICD-10-CM | POA: Diagnosis not present

## 2019-07-29 DIAGNOSIS — G8929 Other chronic pain: Secondary | ICD-10-CM

## 2019-07-29 DIAGNOSIS — R519 Headache, unspecified: Secondary | ICD-10-CM

## 2019-07-29 MED ORDER — GADOBENATE DIMEGLUMINE 529 MG/ML IV SOLN
15.0000 mL | Freq: Once | INTRAVENOUS | Status: AC | PRN
  Administered 2019-07-29: 15 mL via INTRAVENOUS

## 2019-07-30 ENCOUNTER — Encounter: Payer: Self-pay | Admitting: Family Medicine

## 2019-09-19 DIAGNOSIS — N926 Irregular menstruation, unspecified: Secondary | ICD-10-CM | POA: Diagnosis not present

## 2019-09-19 DIAGNOSIS — Z113 Encounter for screening for infections with a predominantly sexual mode of transmission: Secondary | ICD-10-CM | POA: Diagnosis not present

## 2019-09-19 DIAGNOSIS — Z6836 Body mass index (BMI) 36.0-36.9, adult: Secondary | ICD-10-CM | POA: Diagnosis not present

## 2019-09-24 ENCOUNTER — Ambulatory Visit: Payer: BC Managed Care – PPO | Admitting: Neurology

## 2019-10-08 DIAGNOSIS — Z6837 Body mass index (BMI) 37.0-37.9, adult: Secondary | ICD-10-CM | POA: Diagnosis not present

## 2019-10-08 DIAGNOSIS — Z124 Encounter for screening for malignant neoplasm of cervix: Secondary | ICD-10-CM | POA: Diagnosis not present

## 2019-10-08 DIAGNOSIS — N926 Irregular menstruation, unspecified: Secondary | ICD-10-CM | POA: Diagnosis not present

## 2019-10-08 DIAGNOSIS — Z01419 Encounter for gynecological examination (general) (routine) without abnormal findings: Secondary | ICD-10-CM | POA: Diagnosis not present

## 2019-12-02 DIAGNOSIS — M25562 Pain in left knee: Secondary | ICD-10-CM | POA: Diagnosis not present

## 2019-12-02 DIAGNOSIS — S76312A Strain of muscle, fascia and tendon of the posterior muscle group at thigh level, left thigh, initial encounter: Secondary | ICD-10-CM | POA: Diagnosis not present

## 2019-12-04 ENCOUNTER — Telehealth: Payer: Self-pay

## 2019-12-04 ENCOUNTER — Other Ambulatory Visit: Payer: Self-pay

## 2019-12-04 NOTE — Telephone Encounter (Signed)
Medication request sent to Dr Volanda Napoleon for advise

## 2019-12-08 ENCOUNTER — Other Ambulatory Visit: Payer: Self-pay | Admitting: Family Medicine

## 2019-12-08 DIAGNOSIS — Z8619 Personal history of other infectious and parasitic diseases: Secondary | ICD-10-CM

## 2019-12-08 MED ORDER — VALACYCLOVIR HCL 500 MG PO TABS: 500.0000 mg | ORAL_TABLET | ORAL | 1 refills | Status: DC | PRN

## 2019-12-08 NOTE — Telephone Encounter (Signed)
Refill for valtrex sent

## 2020-01-08 DIAGNOSIS — G4709 Other insomnia: Secondary | ICD-10-CM | POA: Diagnosis not present

## 2020-01-08 DIAGNOSIS — F410 Panic disorder [episodic paroxysmal anxiety] without agoraphobia: Secondary | ICD-10-CM | POA: Diagnosis not present

## 2020-01-08 DIAGNOSIS — F4010 Social phobia, unspecified: Secondary | ICD-10-CM | POA: Diagnosis not present

## 2020-03-04 ENCOUNTER — Other Ambulatory Visit: Payer: Self-pay | Admitting: Family Medicine

## 2020-03-04 DIAGNOSIS — Z8619 Personal history of other infectious and parasitic diseases: Secondary | ICD-10-CM

## 2020-04-09 DIAGNOSIS — L02821 Furuncle of head [any part, except face]: Secondary | ICD-10-CM | POA: Diagnosis not present

## 2020-04-09 DIAGNOSIS — B9689 Other specified bacterial agents as the cause of diseases classified elsewhere: Secondary | ICD-10-CM | POA: Diagnosis not present

## 2020-04-09 DIAGNOSIS — L280 Lichen simplex chronicus: Secondary | ICD-10-CM | POA: Diagnosis not present

## 2020-06-18 ENCOUNTER — Telehealth: Payer: Self-pay | Admitting: Family Medicine

## 2020-06-18 NOTE — Telephone Encounter (Signed)
The patient called to make an appointment because she used to live in [place] and moved Wednesday and she has been sick for 12 days. After the news about the water in [place] she is wondering if she has a bacterial infection.  She has diarrhea, throwing up, fatigue, can't eat.  I sent the patient to nurse triage.  Waiting triage notes

## 2020-06-18 NOTE — Telephone Encounter (Signed)
Per nurse triage note patient reports she recently had diarrhea and is now vomiting. Caller believes she might have gotten food poisoning from a restaurant. This all started on July 4th. No other s/s at this time. No fever the caller is aware of at this time.  Per nurse triage patient was advised to go to ED.  Cl;inic RN does not see where patient has went.

## 2020-06-22 ENCOUNTER — Other Ambulatory Visit: Payer: Self-pay

## 2020-06-22 DIAGNOSIS — Z8619 Personal history of other infectious and parasitic diseases: Secondary | ICD-10-CM

## 2020-06-22 NOTE — Telephone Encounter (Signed)
Please call and check on pt.

## 2020-06-22 NOTE — Telephone Encounter (Signed)
Spoke with pt state that she feels a lot better, pt state that she did not go to the UC/ED state that she did not eat for a few days and that helped clear her symptoms, state that the only issue is that she still has nausea which she has taken Phenergan before and seems to work, request for a new prescription sent to [facility] [place] [facility]

## 2020-06-25 NOTE — Telephone Encounter (Signed)
Patient is still having nausea requiring phenergan daily she needs to be evaluated.

## 2020-06-25 NOTE — Telephone Encounter (Signed)
Spoke with pt advised that if still having nausea she will need to be evaluated, pt state that she thinks her migraines are causing the nausea and that she will call her neurology for appointment

## 2020-07-16 DIAGNOSIS — N760 Acute vaginitis: Secondary | ICD-10-CM | POA: Diagnosis not present

## 2020-07-16 DIAGNOSIS — Z6837 Body mass index (BMI) 37.0-37.9, adult: Secondary | ICD-10-CM | POA: Diagnosis not present

## 2020-07-16 DIAGNOSIS — L988 Other specified disorders of the skin and subcutaneous tissue: Secondary | ICD-10-CM | POA: Diagnosis not present

## 2020-08-02 DIAGNOSIS — F4310 Post-traumatic stress disorder, unspecified: Secondary | ICD-10-CM | POA: Diagnosis not present

## 2020-08-17 DIAGNOSIS — K219 Gastro-esophageal reflux disease without esophagitis: Secondary | ICD-10-CM | POA: Diagnosis not present

## 2020-08-17 DIAGNOSIS — R519 Headache, unspecified: Secondary | ICD-10-CM | POA: Diagnosis not present

## 2020-08-17 DIAGNOSIS — Z0000 Encounter for general adult medical examination without abnormal findings: Secondary | ICD-10-CM | POA: Diagnosis not present

## 2020-08-17 DIAGNOSIS — F411 Generalized anxiety disorder: Secondary | ICD-10-CM | POA: Diagnosis not present

## 2020-09-02 DIAGNOSIS — N760 Acute vaginitis: Secondary | ICD-10-CM | POA: Diagnosis not present

## 2020-09-02 DIAGNOSIS — Z113 Encounter for screening for infections with a predominantly sexual mode of transmission: Secondary | ICD-10-CM | POA: Diagnosis not present

## 2020-10-13 DIAGNOSIS — Z3202 Encounter for pregnancy test, result negative: Secondary | ICD-10-CM | POA: Diagnosis not present

## 2020-10-13 DIAGNOSIS — Z01419 Encounter for gynecological examination (general) (routine) without abnormal findings: Secondary | ICD-10-CM | POA: Diagnosis not present

## 2020-10-19 ENCOUNTER — Other Ambulatory Visit: Payer: Self-pay

## 2020-10-19 DIAGNOSIS — Z0000 Encounter for general adult medical examination without abnormal findings: Secondary | ICD-10-CM | POA: Diagnosis not present

## 2020-10-19 DIAGNOSIS — E282 Polycystic ovarian syndrome: Secondary | ICD-10-CM | POA: Diagnosis not present

## 2020-10-19 DIAGNOSIS — K219 Gastro-esophageal reflux disease without esophagitis: Secondary | ICD-10-CM | POA: Diagnosis not present

## 2020-10-20 LAB — URINALYSIS, ROUTINE W REFLEX MICROSCOPIC
Bilirubin, UA: NEGATIVE
Glucose, UA: NEGATIVE
Ketones, UA: NEGATIVE
Leukocytes,UA: NEGATIVE
Nitrite, UA: NEGATIVE
Protein,UA: NEGATIVE
RBC, UA: NEGATIVE
Specific Gravity, UA: 1.024 (ref 1.005–1.030)
Urobilinogen, Ur: 0.2 mg/dL (ref 0.2–1.0)
pH, UA: 5.5 (ref 5.0–7.5)

## 2020-10-20 LAB — COMPREHENSIVE METABOLIC PANEL
ALT: 20 IU/L (ref 0–32)
AST: 15 IU/L (ref 0–40)
Albumin/Globulin Ratio: 1.9 (ref 1.2–2.2)
Albumin: 4.3 g/dL (ref 3.8–4.8)
Alkaline Phosphatase: 109 IU/L (ref 44–121)
BUN/Creatinine Ratio: 15 (ref 9–23)
BUN: 13 mg/dL (ref 6–20)
Bilirubin Total: 0.4 mg/dL (ref 0.0–1.2)
CO2: 26 mmol/L (ref 20–29)
Calcium: 9.5 mg/dL (ref 8.7–10.2)
Chloride: 98 mmol/L (ref 96–106)
Creatinine, Ser: 0.84 mg/dL (ref 0.57–1.00)
GFR calc Af Amer: 103 mL/min/{1.73_m2} (ref >59)
GFR calc non Af Amer: 89 mL/min/{1.73_m2} (ref >59)
Globulin, Total: 2.3 g/dL (ref 1.5–4.5)
Glucose: 86 mg/dL (ref 65–99)
Potassium: 4.4 mmol/L (ref 3.5–5.2)
Sodium: 140 mmol/L (ref 134–144)
Total Protein: 6.6 g/dL (ref 6.0–8.5)

## 2020-10-20 LAB — CBC WITH DIFFERENTIAL/PLATELET
Basophils Absolute: 0 10*3/uL (ref 0.0–0.2)
Basos: 0 %
EOS (ABSOLUTE): 0.1 10*3/uL (ref 0.0–0.4)
Eos: 1 %
Hematocrit: 41.2 % (ref 34.0–46.6)
Hemoglobin: 14.2 g/dL (ref 11.1–15.9)
Immature Grans (Abs): 0 10*3/uL (ref 0.0–0.1)
Immature Granulocytes: 0 %
Lymphocytes Absolute: 2.1 10*3/uL (ref 0.7–3.1)
Lymphs: 21 %
MCH: 30.1 pg (ref 26.6–33.0)
MCHC: 34.5 g/dL (ref 31.5–35.7)
MCV: 87 fL (ref 79–97)
Monocytes Absolute: 0.7 10*3/uL (ref 0.1–0.9)
Monocytes: 7 %
Neutrophils Absolute: 7.1 10*3/uL — ABNORMAL HIGH (ref 1.4–7.0)
Neutrophils: 71 %
Platelets: 277 10*3/uL (ref 150–450)
RBC: 4.72 x10E6/uL (ref 3.77–5.28)
RDW: 12.4 % (ref 11.7–15.4)
WBC: 10 10*3/uL (ref 3.4–10.8)

## 2020-10-20 LAB — TSH: TSH: 3.08 u[IU]/mL (ref 0.450–4.500)

## 2020-10-20 LAB — LIPID PANEL W/O CHOL/HDL RATIO
Cholesterol, Total: 197 mg/dL (ref 100–199)
HDL: 72 mg/dL (ref >39)
LDL Chol Calc (NIH): 101 mg/dL — ABNORMAL HIGH (ref 0–99)
Triglycerides: 141 mg/dL (ref 0–149)
VLDL Cholesterol Cal: 24 mg/dL (ref 5–40)

## 2020-10-20 LAB — HGB A1C W/O EAG: Hgb A1c MFr Bld: 5.5 % (ref 4.8–5.6)

## 2020-11-24 IMAGING — MR MRI HEAD WITHOUT AND WITH CONTRAST
13 series · 48 of 48 positions shown · IV contrast (15 ml multihance)
Comparison: 12/23/2014

CLINICAL DATA: Chronic headache with normal neuro exam. Headache is
left-sided and occipital

EXAM:
MRI HEAD WITHOUT AND WITH CONTRAST
TECHNIQUE: Multiplanar, multiecho pulse sequences of the brain and surrounding
structures were obtained without and with intravenous contrast.
CONTRAST:  15mL MULTIHANCE GADOBENATE DIMEGLUMINE 529 MG/ML IV SOLN

[Series 2: T1 · sagittal · 5.0mm · 0.45mm/px · 1 of 23 slices shown]
[im 1/23]
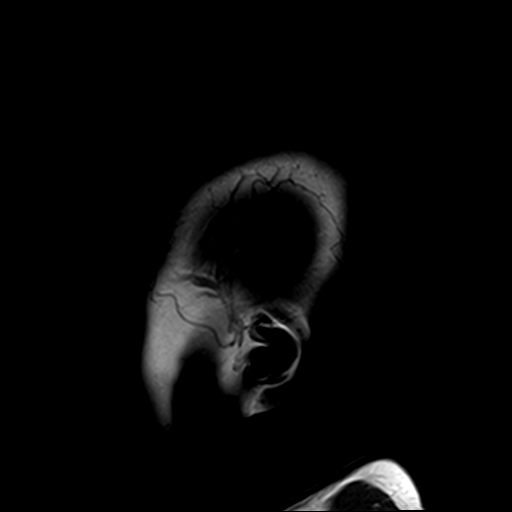

[Series 3: FLAIR · sagittal · 5.0mm · 0.45mm/px · 1 of 27 slices shown (1 of 2)]
[im 1/27]
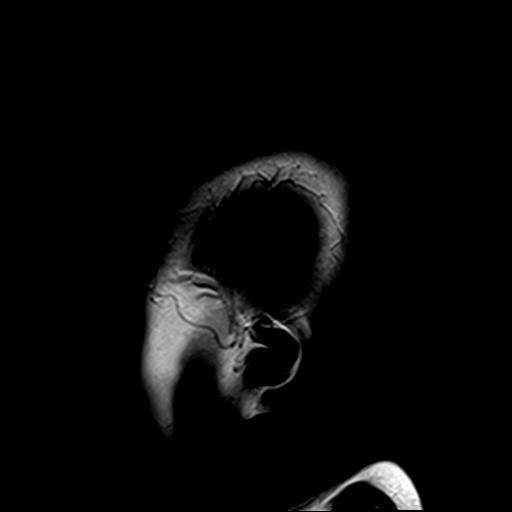

[Series 4: DWI · axial · 3.0mm · 1.80mm/px · z∈[-46,+100]mm · 7 of 100 slices shown (1 of 4)]
[im 1/100]
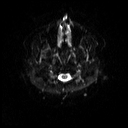
[im 17/100]
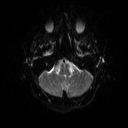
[im 34/100]
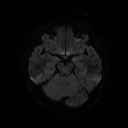
[im 50/100]
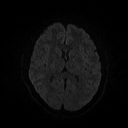
[im 67/100]
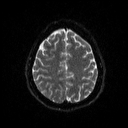
[im 83/100]
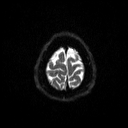
[im 100/100]
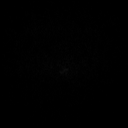

[Series 5: DWI · axial · 3.0mm · 1.80mm/px · z∈[-46,+100]mm · 3 of 46 slices shown (2 of 4)]
[im 1/46]
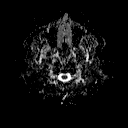
[im 23/46]
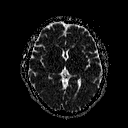
[im 46/46]
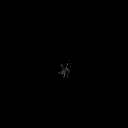

[Series 6: DWI · coronal · 5.0mm · 1.80mm/px · 5 of 68 slices shown (3 of 4)]
[im 1/68]
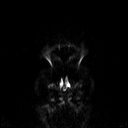
[im 17/68]
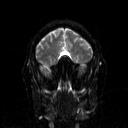
[im 34/68]
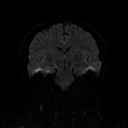
[im 51/68]
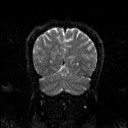
[im 68/68]
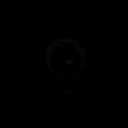

[Series 7: DWI · coronal · 5.0mm · 1.80mm/px · 2 of 34 slices shown (4 of 4)]
[im 1/34]
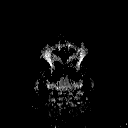
[im 34/34]
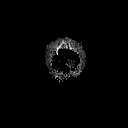

[Series 8: T2 · axial · 5.0mm · 0.51mm/px · 1 of 22 slices shown (1 of 2)]
[im 1/22]
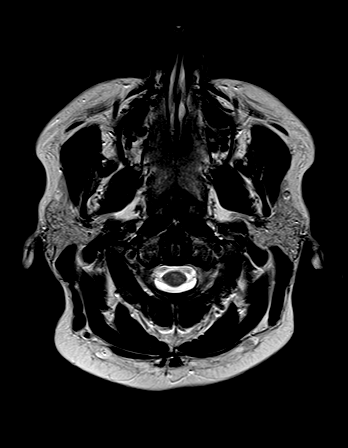

[Series 9: FLAIR · axial · 3.0mm · 0.45mm/px · z∈[-48,+86]mm · 2 of 30 slices shown (2 of 2)]
[im 1/30]
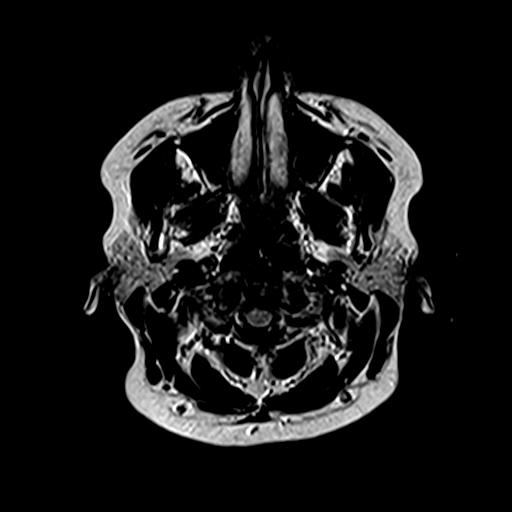
[im 30/30]
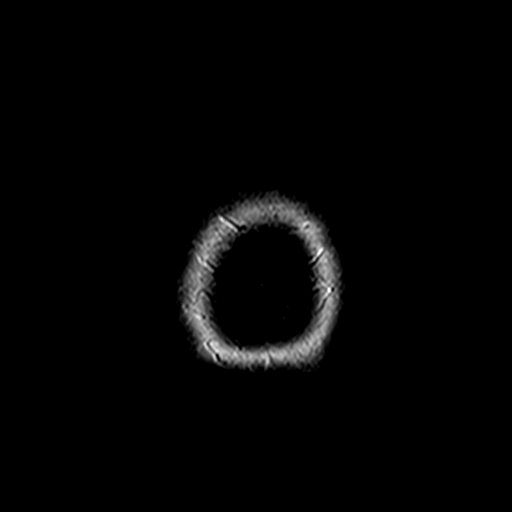

[Series 11: swi_images · axial · 4.0mm · 0.90mm/px · z∈[-50,+89]mm · 2 of 36 slices shown]
[im 1/36]
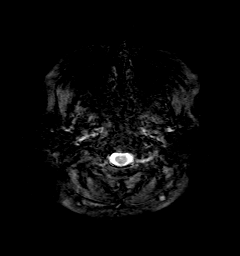
[im 36/36]
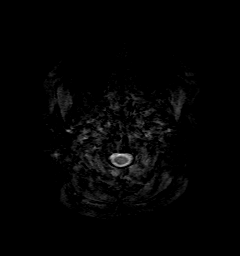

[Series 12: t1_mpr_tra · axial · 1.0mm · 0.75mm/px · z∈[-50,+92]mm · 10 of 144 slices shown (1 of 2)]
[im 1/144]
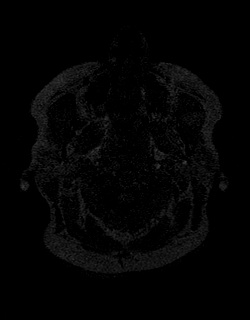
[im 16/144]
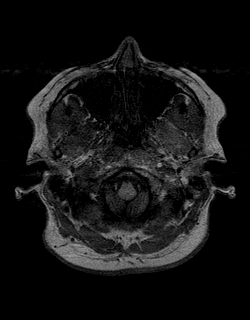
[im 32/144]
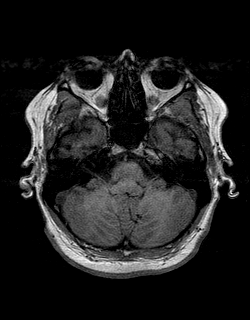
[im 48/144]
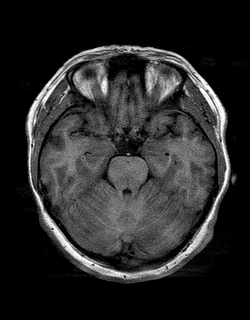
[im 64/144]
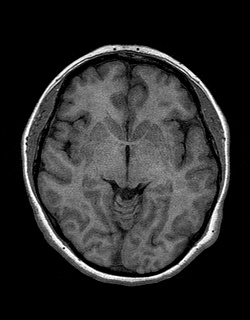
[im 80/144]
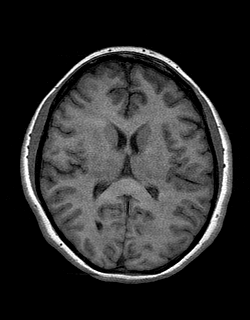
[im 96/144]
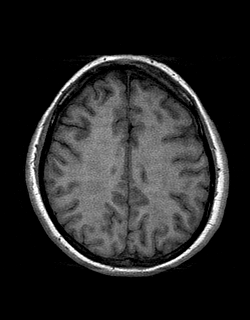
[im 112/144]
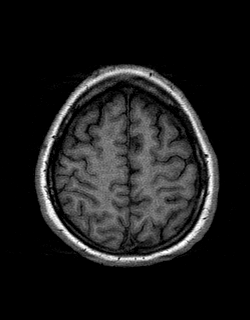
[im 128/144]
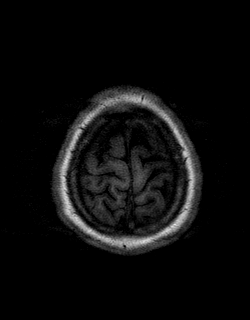
[im 144/144]
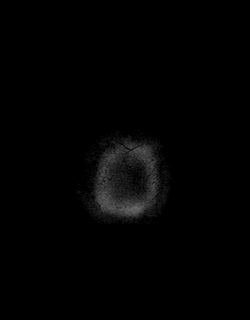

[Series 13: T2 · coronal · 5.0mm · 0.45mm/px · 2 of 25 slices shown (2 of 2)]
[im 1/25]
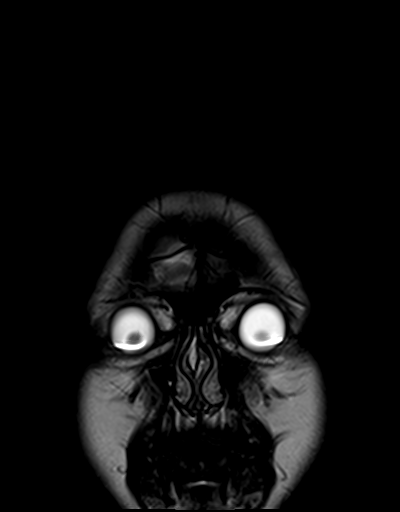
[im 25/25]
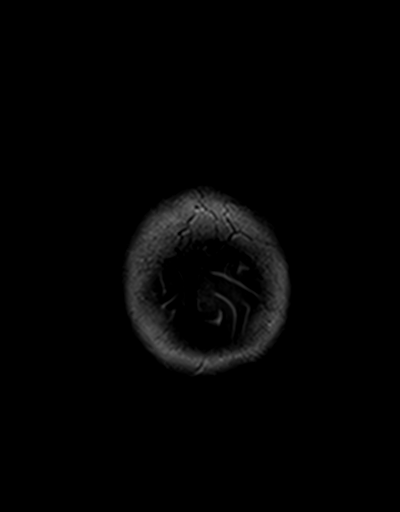

[Series 14: t1_mpr_tra · axial · 1.0mm · 0.75mm/px · z∈[-50,+92]mm · 10 of 144 slices shown (2 of 2)]
[im 1/144]
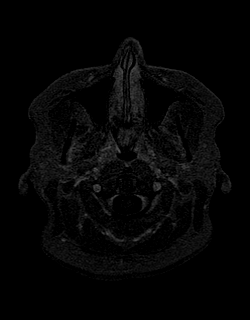
[im 16/144]
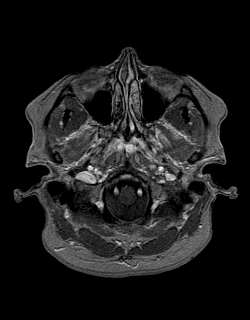
[im 32/144]
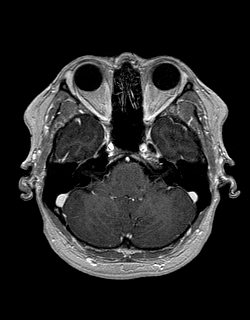
[im 48/144]
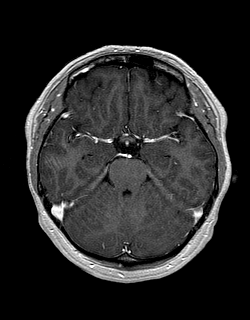
[im 64/144]
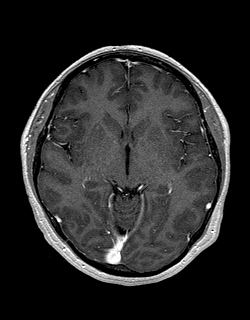
[im 80/144]
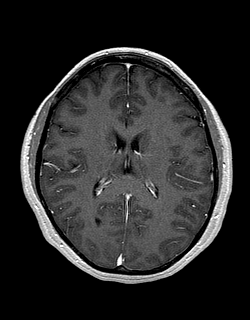
[im 96/144]
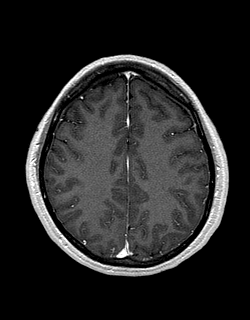
[im 112/144]
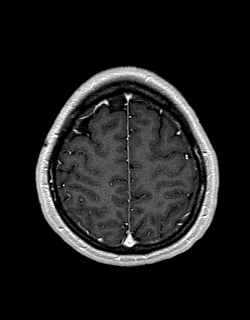
[im 128/144]
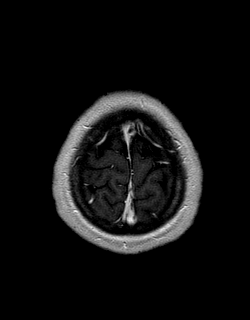
[im 144/144]
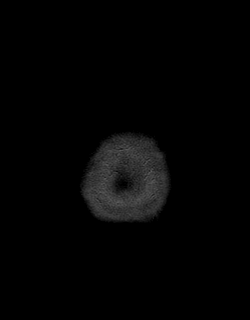

[Series 15: post cor · coronal · 5.0mm · 0.45mm/px · 2 of 25 slices shown]
[im 1/25]
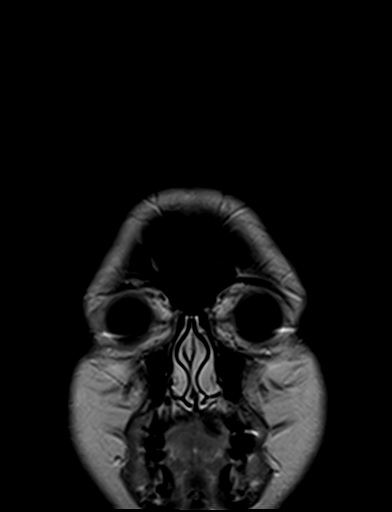
[im 25/25]
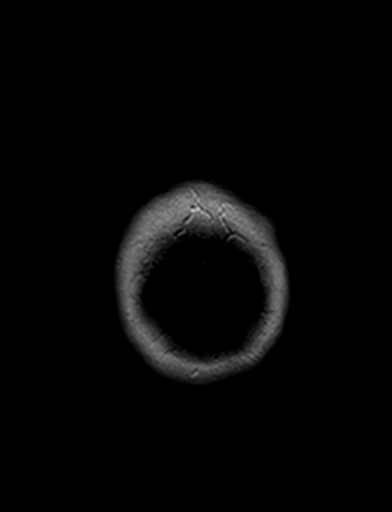

[48 of 48 positions shown; findings below may reference images not displayed]

FINDINGS: Brain: No acute infarction, hemorrhage, hydrocephalus, extra-axial
collection or mass lesion. No atrophy or white matter disease.

Vascular: Major flow voids and vascular enhancements are preserved.

Skull and upper cervical spine: Normal marrow signal.

Sinuses/Orbits: Negative.
IMPRESSION: Normal brain MRI.

## 2021-04-21 DIAGNOSIS — K921 Melena: Secondary | ICD-10-CM

## 2021-04-21 DIAGNOSIS — Z8719 Personal history of other diseases of the digestive system: Secondary | ICD-10-CM | POA: Insufficient documentation

## 2021-04-21 HISTORY — DX: Personal history of other diseases of the digestive system: Z87.19

## 2021-04-21 HISTORY — DX: Melena: K92.1

## 2021-08-10 ENCOUNTER — Other Ambulatory Visit: Payer: Self-pay

## 2021-08-10 ENCOUNTER — Emergency Department (HOSPITAL_COMMUNITY)
Admission: EM | Admit: 2021-08-10 | Discharge: 2021-08-10 | Disposition: A | Discharge status: Home / Self Care | Payer: BC Managed Care – PPO | Attending: Emergency Medicine | Admitting: Emergency Medicine

## 2021-08-10 DIAGNOSIS — R519 Headache, unspecified: Secondary | ICD-10-CM | POA: Diagnosis not present

## 2021-08-10 DIAGNOSIS — Z5321 Procedure and treatment not carried out due to patient leaving prior to being seen by health care provider: Secondary | ICD-10-CM | POA: Insufficient documentation

## 2021-08-10 DIAGNOSIS — R079 Chest pain, unspecified: Secondary | ICD-10-CM | POA: Diagnosis not present

## 2021-08-10 NOTE — ED Notes (Signed)
Pt left AMA due to wait time  

## 2021-08-12 ENCOUNTER — Telehealth: Payer: Self-pay | Admitting: Cardiology

## 2021-08-12 NOTE — Telephone Encounter (Signed)
Left vm for pt to see if her insurance requires a referral and that she will be a new pt as it has been since 2016 that she was seen.

## 2021-08-12 NOTE — Telephone Encounter (Signed)
Patient called and stated that she last seen Dr. Tomie China in 2016. Says that since we do not have her medical records, she is having them sent over via fax. Wants to know if that is good for her to been seen or do she need a referral. Please call back

## 2021-10-04 ENCOUNTER — Telehealth (HOSPITAL_COMMUNITY): Payer: Self-pay | Admitting: Cardiology

## 2021-10-04 ENCOUNTER — Other Ambulatory Visit: Payer: Self-pay

## 2021-10-04 ENCOUNTER — Ambulatory Visit: Payer: BC Managed Care – PPO | Admitting: Cardiology

## 2021-10-04 DIAGNOSIS — F32A Depression, unspecified: Secondary | ICD-10-CM

## 2021-10-04 DIAGNOSIS — G4700 Insomnia, unspecified: Secondary | ICD-10-CM | POA: Insufficient documentation

## 2021-10-04 DIAGNOSIS — R079 Chest pain, unspecified: Secondary | ICD-10-CM | POA: Insufficient documentation

## 2021-10-04 DIAGNOSIS — R519 Headache, unspecified: Secondary | ICD-10-CM | POA: Insufficient documentation

## 2021-10-04 DIAGNOSIS — E669 Obesity, unspecified: Secondary | ICD-10-CM

## 2021-10-04 HISTORY — DX: Depression, unspecified: F32.A

## 2021-10-04 MED ORDER — NITROGLYCERIN 0.4 MG SL SUBL: 0.4000 mg | SUBLINGUAL_TABLET | SUBLINGUAL | 6 refills | Status: AC | PRN

## 2021-10-04 NOTE — Telephone Encounter (Signed)
Called to schedule stress echocardiogram that Dr. Mylinda Latina ordered and patient does not wish to schedule at this time. She will call us back when she is ready. Order will be removed from the active wq and when pt calls back we will reinstate the order.

## 2021-10-04 NOTE — Patient Instructions (Signed)
Medication Instructions:  Your physician has recommended you make the following change in your medication:   Use nitroglycerin 1 tablet placed under the tongue at the first sign of chest pain or an angina attack. 1 tablet may be used every 5 minutes as needed, for up to 15 minutes. Do not take more than 3 tablets in 15 minutes. If pain persist call 911 or go to the nearest ED.   *If you need a refill on your cardiac medications before your next appointment, please call your pharmacy*   Lab Work: None ordered If you have labs (blood work) drawn today and your tests are completely normal, you will receive your results only by: MyChart Message (if you have MyChart) OR A paper copy in the mail If you have any lab test that is abnormal or we need to change your treatment, we will call you to review the results.   Testing/Procedures:      Stress Echocardiogram Information Sheet                                                      Instructions:    1. You may take your morning medications the morning of the test  2. Light breakfast no caffeine  3. Dress prepared to exercise.  4. DO NOT use ANY caffeine or tobacco products 3 hours before appointment.  5. Please bring all current prescription medications.    Follow-Up: At [facility], you and your health needs are our priority.  As part of our continuing mission to provide you with exceptional heart care, we have created designated Provider Care Teams.  These Care Teams include your primary Cardiologist (physician) and Advanced Practice Providers (APPs -  Physician Assistants and Nurse Practitioners) who all work together to provide you with the care you need, when you need it.  We recommend signing up for the patient portal called "MyChart".  Sign up information is provided on this After Visit Summary.  MyChart is used to connect with patients for Virtual Visits (Telemedicine).  Patients are able to view lab/test results, encounter  notes, upcoming appointments, etc.  Non-urgent messages can be sent to your provider as well.   To learn more about what you can do with MyChart, go to [email or web address].    Your next appointment:   6 month(s)  The format for your next appointment:   In Person  Provider:   Belva Crome, MD   Other Instructions NA

## 2021-10-04 NOTE — Progress Notes (Signed)
Cardiology Office Note:    Date:  10/04/2021   ID:  Renee Barrett, DOB July 10, 1981, MRN [id number]     PCP:  [name], MD  Cardiologist:  [name], MD   Referring MD: [name], MD ASSESSMENT:    1. Chest pain of uncertain etiology   2. Obesity (BMI 30.0-34.9)    PLAN:    In order of problems listed above:  Primary prevention stressed with the patient.  Importance of compliance with diet medication stressed and she vocalized understanding.  I reviewed her lipids from last evaluation on the KPN sheet and discussed with her. Chest pain: Symptoms appear atypical for coronary etiology.  She does not have much in terms of risk factors for coronary artery disease.  To reassure her we will do an exercise stress echo and she is understandably happy about it.  I told her to embark on an exercise program on a regular basis if her stress echo is negative and she understands.  She promises to do so.  Sublingual nitroglycerin prescription was sent, its protocol and 911 protocol explained and the patient vocalized understanding questions were answered to the patient's satisfaction Obesity: Weight reduction stressed risks of obesity explained with management with diet and exercise explained and she understood. Patient will be seen in follow-up appointment in 6 months or earlier if the patient has any concerns    Medication Adjustments/Labs and Tests Ordered: Current medicines are reviewed at length with the patient today.  Concerns regarding medicines are outlined above.  No orders of the defined types were placed in this encounter.  No orders of the defined types were placed in this encounter.    History of Present Illness:    Renee Barrett is a 40 y.o. female who is being seen today for the evaluation of chest pain at the request of [name], MD. patient is a pleasant 40 year old female.  She has past medical history of PVCs.  She leads overall a sedentary  lifestyle.  She does walk on a regular basis at times.  With this she has no symptoms.  She is concerned because she has episodes of chest pain stabbing in nature.  No orthopnea or PND.  For this reason she is here to be evaluated.  No radiation of the symptoms to the neck or to the arms.  Sexual activity does not bring around the symptoms or any symptoms at all such as shortness of breath or chest pain.  At the time of my evaluation, the patient is alert awake oriented and in no distress.  Past Medical History:  Diagnosis Date   AMENORRHEA 01/24/2008   Qualifier: Diagnosis of  By: Tawanna Cooler RN, Alvino Chapel     Anxiety    Chest wall pain 07/21/2014   Chronic headache disorder 07/21/2014   Formatting of this note might be different from the original. Evaluated by Neurology 8/20 [facility]); MRI of brain without significant findings   Cigarette nicotine dependence without complication 05/09/2018   Depressive disorder 10/04/2021   GAD (generalized anxiety disorder) 08/31/2011   Gastroesophageal reflux 10/06/2014   Gravida 0 10/08/2014   Headache    Hematochezia 04/21/2021   History of anal fissures 04/21/2021   History of migraine 05/09/2018   Insomnia    Irregular periods 10/06/2014   Left-sided headache 07/10/2019   Female factor infertility 10/08/2014   OVERWEIGHT 01/24/2008   Qualifier: Diagnosis of  By: Tawanna Cooler MD, Eugenio Hoes    Palpitations 07/06/2015   Polycystic  ovaries 10/06/2014   PVC (premature ventricular contraction) 07/06/2015   Stomach ulcer 10/06/2014   TOBACCO ABUSE 01/24/2008   Qualifier: Diagnosis of  By: Tawanna Cooler MD, Eugenio Hoes    Vaginitis 10/06/2014   VIRAL URI 01/19/2008   Qualifier: Diagnosis of  By: Tawanna Cooler MD, Eugenio Hoes     Past Surgical History:  Procedure Laterality Date   NO PAST SURGERIES      Current Medications: Current Meds  Medication Sig   ALPRAZolam (XANAX) 1 MG tablet Take 1 mg by mouth at bedtime.   cyclobenzaprine (FLEXERIL) 10 MG tablet Take 10 mg by mouth as needed for muscle  spasms.   doxycycline (VIBRAMYCIN) 100 MG capsule Take 100 mg by mouth 2 (two) times daily.   escitalopram (LEXAPRO) 10 MG tablet Take 10 mg by mouth at bedtime.     Allergies:   Hydrocodone, Hydrocodone-acetaminophen, Imitrex [sumatriptan], and Latex   Social History   Socioeconomic History   Marital status: Single    Spouse name: Not on file   Number of children: Not on file   Years of education: Not on file   Highest education level: Not on file  Occupational History   Not on file  Tobacco Use   Smoking status: Former    Packs/day: 1.00    Types: Cigarettes    Quit date: 08/18/2018    Years since quitting: 3.1   Smokeless tobacco: Never   Tobacco comments:    pateint is aware this is unhealthy   Vaping Use   Vaping Use: Former  Substance and Sexual Activity   Alcohol use: Yes    Alcohol/week: 0.0 standard drinks    Comment: occ   Drug use: No   Sexual activity: Yes    Partners: Male    Birth control/protection: None  Other Topics Concern   Not on file  Social History Narrative   Not on file   Social Determinants of Health   Financial Resource Strain: Not on file  Food Insecurity: Not on file  Transportation Needs: Not on file  Physical Activity: Not on file  Stress: Not on file  Social Connections: Not on file     Family History: The patient's family history includes Cancer in her paternal grandmother; Diabetes in her maternal grandfather; Rheum arthritis in her mother; Thyroid disease in her mother.  ROS:   Please see the history of present illness.    All other systems reviewed and are negative.  EKGs/Labs/Other Studies Reviewed:    The following studies were reviewed today: EKG reveals sinus rhythm and normal findings.   Recent Labs: 10/19/2020: ALT 20; BUN 13; Creatinine, Ser 0.84; Hemoglobin 14.2; Platelets 277; Potassium 4.4; Sodium 140; TSH 3.080  Recent Lipid Panel    Component Value Date/Time   CHOL 197 10/19/2020 0849   TRIG 141  10/19/2020 0849   HDL 72 10/19/2020 0849   CHOLHDL 3 06/25/2019 1547   VLDL 17.4 06/25/2019 1547   LDLCALC 101 (H) 10/19/2020 0849    Physical Exam:    VS:  BP 132/80   Pulse 73   Ht 5\' 1"  (1.549 m)   Wt 170 lb 1.3 oz (77.1 kg)   BMI 32.14 kg/m     Wt Readings from Last 3 Encounters:  10/04/21 170 lb 1.3 oz (77.1 kg)  07/10/19 178 lb 8 oz (81 kg)  06/25/19 179 lb (81.2 kg)     GEN: Patient is in no acute distress HEENT: Normal NECK: No JVD; No carotid bruits LYMPHATICS:  No lymphadenopathy CARDIAC: S1 S2 regular, 2/6 systolic murmur at the apex. RESPIRATORY:  Clear to auscultation without rales, wheezing or rhonchi  ABDOMEN: Soft, non-tender, non-distended MUSCULOSKELETAL:  No edema; No deformity  SKIN: Warm and dry NEUROLOGIC:  Alert and oriented x 3 PSYCHIATRIC:  Normal affect    Signed, [name], MD  10/04/2021 10:15 AM    [facility]

## 2022-01-25 ENCOUNTER — Telehealth (HOSPITAL_COMMUNITY): Payer: Self-pay

## 2022-01-25 NOTE — Telephone Encounter (Signed)
Detailed instructions left on the patient's answering machine. Asked to call back with any questions. Renee Barrett EMTP 

## 2022-01-31 ENCOUNTER — Ambulatory Visit (HOSPITAL_COMMUNITY): Payer: BC Managed Care – PPO

## 2022-01-31 ENCOUNTER — Ambulatory Visit (HOSPITAL_COMMUNITY): Payer: BC Managed Care – PPO | Attending: Internal Medicine

## 2022-01-31 ENCOUNTER — Other Ambulatory Visit: Payer: Self-pay

## 2022-01-31 DIAGNOSIS — R079 Chest pain, unspecified: Secondary | ICD-10-CM | POA: Insufficient documentation

## 2022-01-31 MED ORDER — PERFLUTREN LIPID MICROSPHERE
1.0000 mL | INTRAVENOUS | Status: AC | PRN
  Administered 2022-01-31: 3 mL via INTRAVENOUS

## 2022-05-10 ENCOUNTER — Other Ambulatory Visit: Payer: Self-pay | Admitting: Internal Medicine

## 2022-05-10 DIAGNOSIS — M542 Cervicalgia: Secondary | ICD-10-CM

## 2022-05-10 DIAGNOSIS — R200 Anesthesia of skin: Secondary | ICD-10-CM

## 2022-05-13 ENCOUNTER — Other Ambulatory Visit: Payer: BC Managed Care – PPO

## 2022-05-15 ENCOUNTER — Encounter: Payer: Self-pay | Admitting: Neurology

## 2022-05-15 ENCOUNTER — Ambulatory Visit: Payer: BC Managed Care – PPO | Admitting: Neurology

## 2022-05-15 VITALS — BP 132/87 | HR 86 | Ht 61.0 in | Wt 206.5 lb

## 2022-05-15 DIAGNOSIS — M542 Cervicalgia: Secondary | ICD-10-CM | POA: Diagnosis not present

## 2022-05-15 DIAGNOSIS — G43709 Chronic migraine without aura, not intractable, without status migrainosus: Secondary | ICD-10-CM | POA: Diagnosis not present

## 2022-05-15 DIAGNOSIS — R202 Paresthesia of skin: Secondary | ICD-10-CM | POA: Insufficient documentation

## 2022-05-15 MED ORDER — VENLAFAXINE HCL ER 37.5 MG PO CP24: 37.5000 mg | ORAL_CAPSULE | Freq: Every day | ORAL | 11 refills | Status: DC
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
## 2022-05-16 ENCOUNTER — Ambulatory Visit
Admission: EM | Admit: 2022-05-16 | Discharge: 2022-05-16 | Disposition: A | Discharge status: Home / Self Care | Payer: BC Managed Care – PPO | Attending: Nurse Practitioner | Admitting: Nurse Practitioner

## 2022-05-16 ENCOUNTER — Telehealth: Payer: Self-pay | Admitting: Neurology

## 2022-05-16 DIAGNOSIS — T63301A Toxic effect of unspecified spider venom, accidental (unintentional), initial encounter: Secondary | ICD-10-CM | POA: Diagnosis not present

## 2022-05-16 NOTE — Telephone Encounter (Signed)
MR brain w/wo Renee Barrett Numbers: [id number] 05/16/22-06/14/22 sent to GI she already has an auth on file for cervical spine I will ask them to schedule her for that too.

## 2022-05-16 NOTE — Discharge Instructions (Addendum)
Continue the antibiotic you are currently taking.  Make sure you take a full 10-day course of the doxycycline. May apply ice to the left index finger to help with pain or swelling. As discussed, do not pick or disrupt the skin of the finger. May take ibuprofen or Tylenol for pain, fever, or general discomfort. If you develop fever, chills, generalized fatigue, foul-smelling drainage, worsening redness, streaking, or other concerns, please follow-up. Follow-up as needed.

## 2022-05-16 NOTE — ED Provider Notes (Addendum)
[facility]    CSN: [id number] Arrival date & time: 05/16/22  1716      History   Chief Complaint Chief Complaint  Patient presents with   Insect Bite    HPI Renee Barrett is a 41 y.o. female.   HPI  Patient presents with suspected spider bite to the left index finger.  Patient believes that symptoms started 3 days ago.  She states that she noticed 2 areas on her left index finger that looked like 2 pimples.  She states they began to increase in size.  She states last evening, she drained one of the areas on her finger.  Now she is complaining of pain that radiates from her finger into the left arm.  Patient states "I think the infections in my blood".  Patient completed a TeleDoc appointment and was prescribed doxycycline.  She is taken 3 days worth of medication at this time.  She denies fever, chills, generalized fatigue, chest pain, abdominal pain, nausea, vomiting, or diarrhea.  The patient has a picture of a black spider that she killed in her bedroom 1 to 2 days ago.  Past Medical History:  Diagnosis Date   AMENORRHEA 01/24/2008   Qualifier: Diagnosis of  By: Sherren Mocha RN, Dorian Pod     Anxiety    Chest wall pain 07/21/2014   Chronic headache disorder 07/21/2014   Formatting of this note might be different from the original. Evaluated by Neurology 8/20 [facility]); MRI of brain without significant findings   Cigarette nicotine dependence without complication [id number]   Depressive disorder 10/04/2021   GAD (generalized anxiety disorder) 08/31/2011   Gastroesophageal reflux 10/06/2014   Gravida 0 10/08/2014   Headache    Hematochezia 04/21/2021   History of anal fissures 04/21/2021   History of migraine 05/09/2018   Insomnia    Irregular periods 10/06/2014   Left-sided headache 07/10/2019   Female factor infertility 10/08/2014   OVERWEIGHT 01/24/2008   Qualifier: Diagnosis of  By: Sherren Mocha MD, Jory Ee    Palpitations 07/06/2015   Polycystic ovaries 10/06/2014   PVC (premature  ventricular contraction) 07/06/2015   Stomach ulcer 10/06/2014   TOBACCO ABUSE 01/24/2008   Qualifier: Diagnosis of  By: Sherren Mocha MD, Jory Ee    Vaginitis 10/06/2014   VIRAL URI 01/19/2008   Qualifier: Diagnosis of  By: Sherren Mocha MD, Jory Ee     Patient Active Problem List   Diagnosis Date Noted   Paresthesia 05/15/2022   Chronic migraine w/o aura w/o status migrainosus, not intractable 05/15/2022   Neck pain 05/15/2022   Headache 10/04/2021   Insomnia 10/04/2021   Depressive disorder 10/04/2021   Chest pain of uncertain etiology 99991111   Obesity (BMI 30.0-34.9) 10/04/2021   Hematochezia 04/21/2021   History of anal fissures 04/21/2021   Left-sided headache 07/10/2019   History of migraine 05/09/2018   Cigarette nicotine dependence without complication [id number]   Anxiety 05/09/2018   Palpitations 07/06/2015   PVC (premature ventricular contraction) 07/06/2015   Gravida 0 10/08/2014   Female factor infertility 10/08/2014   Gastroesophageal reflux 10/06/2014   Stomach ulcer 10/06/2014   Irregular periods 10/06/2014   Polycystic ovaries 10/06/2014   Vaginitis 10/06/2014   Chronic headache disorder 07/21/2014   Chest wall pain 07/21/2014   GAD (generalized anxiety disorder) 08/31/2011   OVERWEIGHT 01/24/2008   TOBACCO ABUSE 01/24/2008   AMENORRHEA 01/24/2008   VIRAL URI 01/19/2008    Past Surgical History:  Procedure Laterality Date   NO PAST SURGERIES  OB History   No obstetric history on file.      Home Medications    Prior to Admission medications   Medication Sig Start Date End Date Taking? Authorizing Provider  ALPRAZolam Duanne Moron) 1 MG tablet Take 1 mg by mouth at bedtime.    [provider]  cyclobenzaprine (FLEXERIL) 10 MG tablet Take 10 mg by mouth as needed for muscle spasms. 11/10/18   [provider]  doxycycline (VIBRAMYCIN) 100 MG capsule Take 100 mg by mouth 2 (two) times daily. 09/27/21   [provider]  nitroGLYCERIN  (NITROSTAT) 0.4 MG SL tablet Place 1 tablet (0.4 mg total) under the tongue every 5 (five) minutes as needed. Patient not taking: Reported on 05/15/2022 10/04/21 01/02/22  Revankar, Reita Cliche, MD  venlafaxine XR (EFFEXOR XR) 37.5 MG 24 hr capsule Take 1 capsule (37.5 mg total) by mouth daily with breakfast. 05/15/22   Marcial Pacas, MD    Family History Family History  Problem Relation Age of Onset   Thyroid disease Mother    Rheum arthritis Mother    Diabetes Maternal Grandfather    Cancer Paternal Grandmother        breast     Social History Social History   Tobacco Use   Smoking status: Former    Packs/day: 1.00    Types: Cigarettes    Quit date: 08/18/2018    Years since quitting: 3.7   Smokeless tobacco: Never   Tobacco comments:    pateint is aware this is unhealthy   Vaping Use   Vaping Use: Every day   Substances: CBD, Flavoring  Substance Use Topics   Alcohol use: Yes    Alcohol/week: 0.0 standard drinks of alcohol    Comment: occ   Drug use: No     Allergies   Hydrocodone, Hydrocodone-acetaminophen, Imitrex [sumatriptan], and Latex   Review of Systems Review of Systems Per HPI  Physical Exam Triage Vital Signs ED Triage Vitals  Enc Vitals Group     BP 05/16/22 1728 126/84     Pulse Rate 05/16/22 1728 89     Resp 05/16/22 1728 18     Temp 05/16/22 1728 98.8 F (37.1 C)     Temp Source 05/16/22 1728 Oral     SpO2 05/16/22 1728 96 %     Weight --      Height --      Head Circumference --      Peak Flow --      Pain Score 05/16/22 1723 7     Pain Loc --      Pain Edu? --      Excl. in [place]? --    No data found.  Updated Vital Signs BP 126/84 (BP Location: Right Arm)   Pulse 89   Temp 98.8 F (37.1 C) (Oral)   Resp 18   LMP 04/03/2022 (Approximate)   SpO2 96%   Visual Acuity Right Eye Distance:   Left Eye Distance:   Bilateral Distance:    Right Eye Near:   Left Eye Near:    Bilateral Near:     Physical Exam Vitals and nursing note  reviewed.  Constitutional:      Appearance: Normal appearance.  HENT:     Head: Normocephalic.  Eyes:     Extraocular Movements: Extraocular movements intact.     Conjunctiva/sclera: Conjunctivae normal.     Pupils: Pupils are equal, round, and reactive to light.  Cardiovascular:     Rate and Rhythm:  Normal rate.  Pulmonary:     Effort: Pulmonary effort is normal.  Musculoskeletal:     Cervical back: Normal range of motion.  Skin:    General: Skin is warm and dry.     Capillary Refill: Capillary refill takes less than 2 seconds.     Comments: 2 blisters noted to the medial aspect of the left index finger.  Left index finger is swollen.  Decreased range of motion due to swelling.  There is no redness, warmth, fluctuance, or drainage present at this time.  Neurological:     Mental Status: She is alert.      UC Treatments / Results  Labs (all labs ordered are listed, but only abnormal results are displayed) Labs Reviewed - No data to display  EKG   Radiology No results found.  Procedures Procedures (including critical care time)  Medications Ordered in UC Medications - No data to display  Initial Impression / Assessment and Plan / UC Course  I have reviewed the triage vital signs and the nursing notes.  Pertinent labs & imaging results that were available during my care of the patient were reviewed by me and considered in my medical decision making (see chart for details).  Patient presents with a possible spider bite to the left index finger.  Symptoms started approximately 3 days ago.  Patient's vital signs are stable, exam is reassuring.  Patient is currently being treated with doxycycline that was previously prescribed by TeleDoc.  Patient advised to continue the doxycycline for a full 10-day course.  Supportive care recommendations were provided to the patient with strict return precautions of when to follow-up. Final Clinical Impressions(s) / UC Diagnoses   Final  diagnoses:  Spider bite wound, accidental or unintentional, initial encounter     Discharge Instructions      Continue the antibiotic you are currently taking.  Make sure you take a full 10-day course of the doxycycline. May apply ice to the left index finger to help with pain or swelling. As discussed, do not pick or disrupt the skin of the finger. May take ibuprofen or Tylenol for pain, fever, or general discomfort. If you develop fever, chills, generalized fatigue, foul-smelling drainage, worsening redness, streaking, or other concerns, please follow-up. Follow-up as needed.    ED Prescriptions   None    PDMP not reviewed this encounter.   Tish Men, NP 05/16/22 1749    Tish Men, NP 05/16/22 1751

## 2022-05-16 NOTE — ED Notes (Signed)
Bacitracin and telfa applied to site and wrapped in coban. Pt tolerated well. Site management reviewed. Pt verbalized understanding.

## 2022-05-16 NOTE — ED Triage Notes (Signed)
Pt states she thinks she was bit by a bug on her index finger on her left hand that she thinks happened on Saturday  Pt states that the mark started off as a 2 little pimples and it grew bigger with pressure  Pt states she popped the bumps to relieve the pressure  Pt states she is in pain from her finger up her left arm  Pt states she had some left over Doxycycline and she took 2 tablets a day for 3 days and 1 tablet today

## 2022-05-17 ENCOUNTER — Ambulatory Visit
Admission: RE | Admit: 2022-05-17 | Discharge: 2022-05-17 | Disposition: A | Discharge status: Home / Self Care | Payer: BC Managed Care – PPO | Source: Ambulatory Visit | Attending: Neurology | Admitting: Neurology

## 2022-05-17 DIAGNOSIS — G43709 Chronic migraine without aura, not intractable, without status migrainosus: Secondary | ICD-10-CM

## 2022-05-17 DIAGNOSIS — R202 Paresthesia of skin: Secondary | ICD-10-CM | POA: Diagnosis not present

## 2022-05-17 DIAGNOSIS — M542 Cervicalgia: Secondary | ICD-10-CM

## 2022-05-17 MED ORDER — GADOBENATE DIMEGLUMINE 529 MG/ML IV SOLN
19.0000 mL | Freq: Once | INTRAVENOUS | Status: AC | PRN
  Administered 2022-05-17: 19 mL via INTRAVENOUS

## 2022-06-15 ENCOUNTER — Other Ambulatory Visit: Payer: Self-pay | Admitting: Neurology

## 2022-06-15 NOTE — Telephone Encounter (Signed)
Rx refilled for 90 day supply. 

## 2022-10-05 ENCOUNTER — Ambulatory Visit: Payer: BC Managed Care – PPO | Admitting: Neurology

## 2023-02-08 ENCOUNTER — Other Ambulatory Visit (HOSPITAL_BASED_OUTPATIENT_CLINIC_OR_DEPARTMENT_OTHER): Payer: Self-pay | Admitting: Sports Medicine

## 2023-02-08 DIAGNOSIS — M47816 Spondylosis without myelopathy or radiculopathy, lumbar region: Secondary | ICD-10-CM

## 2023-02-08 DIAGNOSIS — M545 Low back pain, unspecified: Secondary | ICD-10-CM

## 2023-02-08 DIAGNOSIS — G8929 Other chronic pain: Secondary | ICD-10-CM

## 2023-02-21 ENCOUNTER — Ambulatory Visit (HOSPITAL_BASED_OUTPATIENT_CLINIC_OR_DEPARTMENT_OTHER)
Admission: RE | Admit: 2023-02-21 | Discharge: 2023-02-21 | Disposition: A | Discharge status: Home / Self Care | Payer: BC Managed Care – PPO | Source: Ambulatory Visit | Attending: Sports Medicine | Admitting: Sports Medicine

## 2023-02-21 DIAGNOSIS — G8929 Other chronic pain: Secondary | ICD-10-CM | POA: Insufficient documentation

## 2023-02-21 DIAGNOSIS — M545 Low back pain, unspecified: Secondary | ICD-10-CM | POA: Insufficient documentation

## 2023-02-21 DIAGNOSIS — M533 Sacrococcygeal disorders, not elsewhere classified: Secondary | ICD-10-CM | POA: Diagnosis present

## 2023-02-21 DIAGNOSIS — M47816 Spondylosis without myelopathy or radiculopathy, lumbar region: Secondary | ICD-10-CM | POA: Diagnosis not present

## 2023-03-22 ENCOUNTER — Other Ambulatory Visit: Payer: Self-pay | Admitting: Obstetrics and Gynecology

## 2023-03-22 DIAGNOSIS — R928 Other abnormal and inconclusive findings on diagnostic imaging of breast: Secondary | ICD-10-CM

## 2023-03-29 ENCOUNTER — Ambulatory Visit
Admission: RE | Admit: 2023-03-29 | Discharge: 2023-03-29 | Disposition: A | Discharge status: Home / Self Care | Payer: BC Managed Care – PPO | Source: Ambulatory Visit | Attending: Obstetrics and Gynecology | Admitting: Obstetrics and Gynecology

## 2023-03-29 DIAGNOSIS — R928 Other abnormal and inconclusive findings on diagnostic imaging of breast: Secondary | ICD-10-CM

## 2024-05-09 ENCOUNTER — Other Ambulatory Visit: Payer: Self-pay

## 2024-05-09 ENCOUNTER — Encounter (HOSPITAL_BASED_OUTPATIENT_CLINIC_OR_DEPARTMENT_OTHER): Payer: Self-pay | Admitting: Emergency Medicine

## 2024-05-09 ENCOUNTER — Emergency Department (HOSPITAL_BASED_OUTPATIENT_CLINIC_OR_DEPARTMENT_OTHER)
Admission: EM | Admit: 2024-05-09 | Discharge: 2024-05-09 | Disposition: A | Discharge status: Home / Self Care | Attending: Emergency Medicine | Admitting: Emergency Medicine

## 2024-05-09 DIAGNOSIS — Z91040 Latex allergy status: Secondary | ICD-10-CM | POA: Insufficient documentation

## 2024-05-09 DIAGNOSIS — R7989 Other specified abnormal findings of blood chemistry: Secondary | ICD-10-CM | POA: Insufficient documentation

## 2024-05-09 DIAGNOSIS — E8889 Other specified metabolic disorders: Secondary | ICD-10-CM | POA: Insufficient documentation

## 2024-05-09 DIAGNOSIS — R112 Nausea with vomiting, unspecified: Secondary | ICD-10-CM | POA: Insufficient documentation

## 2024-05-09 DIAGNOSIS — E8729 Other acidosis: Secondary | ICD-10-CM

## 2024-05-09 LAB — URINALYSIS, ROUTINE W REFLEX MICROSCOPIC
Bilirubin Urine: NEGATIVE
Glucose, UA: NEGATIVE mg/dL
Hgb urine dipstick: NEGATIVE
Ketones, ur: 80 mg/dL — AB
Leukocytes,Ua: NEGATIVE
Nitrite: NEGATIVE
Protein, ur: NEGATIVE mg/dL
Specific Gravity, Urine: 1.015 (ref 1.005–1.030)
pH: 5.5 (ref 5.0–8.0)

## 2024-05-09 LAB — CBG MONITORING, ED
Glucose-Capillary: 64 mg/dL — ABNORMAL LOW (ref 70–99)
Glucose-Capillary: 94 mg/dL (ref 70–99)

## 2024-05-09 LAB — COMPREHENSIVE METABOLIC PANEL WITH GFR
ALT: 25 U/L (ref 0–44)
AST: 25 U/L (ref 15–41)
Albumin: 4.6 g/dL (ref 3.5–5.0)
Alkaline Phosphatase: 68 U/L (ref 38–126)
Anion gap: 22 — ABNORMAL HIGH (ref 5–15)
BUN: 8 mg/dL (ref 6–20)
CO2: 16 mmol/L — ABNORMAL LOW (ref 22–32)
Calcium: 9.7 mg/dL (ref 8.9–10.3)
Chloride: 99 mmol/L (ref 98–111)
Creatinine, Ser: 1.06 mg/dL — ABNORMAL HIGH (ref 0.44–1.00)
GFR, Estimated: >60 mL/min (ref >60)
Glucose, Bld: 67 mg/dL — ABNORMAL LOW (ref 70–99)
Potassium: 4 mmol/L (ref 3.5–5.1)
Sodium: 137 mmol/L (ref 135–145)
Total Bilirubin: 0.7 mg/dL (ref 0.0–1.2)
Total Protein: 7.9 g/dL (ref 6.5–8.1)

## 2024-05-09 LAB — CBC WITH DIFFERENTIAL/PLATELET
Abs Immature Granulocytes: 0.02 10*3/uL (ref 0.00–0.07)
Basophils Absolute: 0 10*3/uL (ref 0.0–0.1)
Basophils Relative: 0 %
Eosinophils Absolute: 0 10*3/uL (ref 0.0–0.5)
Eosinophils Relative: 1 %
HCT: 41.8 % (ref 36.0–46.0)
Hemoglobin: 14.4 g/dL (ref 12.0–15.0)
Immature Granulocytes: 0 %
Lymphocytes Relative: 23 %
Lymphs Abs: 1.6 10*3/uL (ref 0.7–4.0)
MCH: 29.5 pg (ref 26.0–34.0)
MCHC: 34.4 g/dL (ref 30.0–36.0)
MCV: 85.7 fL (ref 80.0–100.0)
Monocytes Absolute: 0.5 10*3/uL (ref 0.1–1.0)
Monocytes Relative: 7 %
Neutro Abs: 4.8 10*3/uL (ref 1.7–7.7)
Neutrophils Relative %: 69 %
Platelets: 250 10*3/uL (ref 150–400)
RBC: 4.88 MIL/uL (ref 3.87–5.11)
RDW: 12.3 % (ref 11.5–15.5)
WBC: 6.9 10*3/uL (ref 4.0–10.5)
nRBC: 0 % (ref 0.0–0.2)

## 2024-05-09 LAB — PREGNANCY, URINE: Preg Test, Ur: NEGATIVE

## 2024-05-09 LAB — LIPASE, BLOOD: Lipase: 25 U/L (ref 11–51)

## 2024-05-09 MED ORDER — SODIUM CHLORIDE 0.9 % IV BOLUS
1000.0000 mL | Freq: Once | INTRAVENOUS | Status: AC
  Administered 2024-05-09: 1000 mL via INTRAVENOUS

## 2024-05-09 MED ORDER — METOCLOPRAMIDE HCL 10 MG PO TABS: 10.0000 mg | ORAL_TABLET | Freq: Four times a day (QID) | ORAL | 0 refills | Status: AC

## 2024-05-09 MED ORDER — DIPHENHYDRAMINE HCL 50 MG/ML IJ SOLN
25.0000 mg | Freq: Once | INTRAMUSCULAR | Status: AC
  Administered 2024-05-09: 25 mg via INTRAVENOUS
  Filled 2024-05-09: qty 1

## 2024-05-09 MED ORDER — METOCLOPRAMIDE HCL 5 MG/ML IJ SOLN
10.0000 mg | Freq: Once | INTRAMUSCULAR | Status: AC
  Administered 2024-05-09: 10 mg via INTRAVENOUS
  Filled 2024-05-09: qty 2

## 2024-05-09 MED ORDER — DEXTROSE 50 % IV SOLN: 1.0000 | Freq: Once | INTRAVENOUS | Status: DC

## 2024-05-09 NOTE — ED Notes (Signed)
 Pt provided with ginger ale and peanut butter crackers per provider's orders.

## 2024-05-09 NOTE — ED Provider Notes (Signed)
 [facility] Provider Note   CSN: [id number] Arrival date & time: 05/09/24  1244     History  Chief Complaint  Patient presents with   Emesis    Renee Barrett is a 43 y.o. female.  Patient with no past surgical history presents to the emergency department for evaluation of nausea and vomiting.  Patient has had nausea and decreased appetite for 5 days.  3 days ago she developed vomiting and she has been unable to keep anything on her stomach since that time.  She had a similar episode in the past.  She was told by GI that she had IBS and stress per her report.  She states that she was improved with acid blocking medication at that time.  Patient denies associated abdominal pain.  She is urinating less but without dysuria or hematuria.  No chest pain or shortness of breath.  No fevers.  Patient was seen at [facility] today.  She had testing which revealed dehydration and she was encouraged to come to the emergency department for IV fluids.  She states that they did send in Pepcid and another medicine.       Home Medications Prior to Admission medications   Medication Sig Start Date End Date Taking? Authorizing Provider  ALPRAZolam (XANAX) 1 MG tablet Take 1 mg by mouth at bedtime.    [provider]  cyclobenzaprine  (FLEXERIL ) 10 MG tablet Take 10 mg by mouth as needed for muscle spasms. 11/10/18   [provider]  doxycycline  (VIBRAMYCIN ) 100 MG capsule Take 100 mg by mouth 2 (two) times daily. 09/27/21   [provider]  nitroGLYCERIN  (NITROSTAT ) 0.4 MG SL tablet Place 1 tablet (0.4 mg total) under the tongue every 5 (five) minutes as needed. Patient not taking: Reported on 05/15/2022 10/04/21 01/02/22  Revankar, Rajan R, MD  venlafaxine  XR (EFFEXOR -XR) 37.5 MG 24 hr capsule TAKE 1 CAPSULE BY MOUTH DAILY WITH BREAKFAST. 06/15/22   Phebe Brasil, MD      Allergies    Hydrocodone , Hydrocodone -acetaminophen ,  Imitrex  [sumatriptan ], and Latex    Review of Systems   Review of Systems  Physical Exam Updated Vital Signs BP (!) 133/90   Pulse 100   Temp 98.1 F (36.7 C) (Oral)   Resp 16   Ht 5' (1.524 m)   Wt 75.8 kg   SpO2 99%   BMI 32.61 kg/m  Physical Exam Vitals and nursing note reviewed.  Constitutional:      General: She is not in acute distress.    Appearance: She is well-developed.  HENT:     Head: Normocephalic and atraumatic.     Right Ear: External ear normal.     Left Ear: External ear normal.     Nose: Nose normal.     Mouth/Throat:     Mouth: Mucous membranes are dry.     Comments: Slightly dry mucous membranes Eyes:     Conjunctiva/sclera: Conjunctivae normal.  Cardiovascular:     Rate and Rhythm: Normal rate and regular rhythm.     Heart sounds: No murmur heard. Pulmonary:     Effort: No respiratory distress.     Breath sounds: No wheezing, rhonchi or rales.  Abdominal:     Palpations: Abdomen is soft.     Tenderness: There is no abdominal tenderness. There is no guarding or rebound.     Comments: Abdomen soft and nontender  Musculoskeletal:     Cervical back: Normal range  of motion and neck supple.     Right lower leg: No edema.     Left lower leg: No edema.  Skin:    General: Skin is warm and dry.     Findings: No rash.  Neurological:     General: No focal deficit present.     Mental Status: She is alert. Mental status is at baseline.     Motor: No weakness.  Psychiatric:        Mood and Affect: Mood normal.     ED Results / Procedures / Treatments   Labs (all labs ordered are listed, but only abnormal results are displayed) Labs Reviewed  COMPREHENSIVE METABOLIC PANEL WITH GFR - Abnormal; Notable for the following components:      Result Value   CO2 16 (*)    Glucose, Bld 67 (*)    Creatinine, Ser 1.06 (*)    Anion gap 22 (*)    All other components within normal limits  URINALYSIS, ROUTINE W REFLEX MICROSCOPIC - Abnormal; Notable for the  following components:   Ketones, ur 80 (*)    All other components within normal limits  CBG MONITORING, ED - Abnormal; Notable for the following components:   Glucose-Capillary 64 (*)    All other components within normal limits  LIPASE, BLOOD  CBC WITH DIFFERENTIAL/PLATELET  PREGNANCY, URINE  CBG MONITORING, ED    EKG None  Radiology No results found.  Procedures Procedures    Medications Ordered in ED Medications  sodium chloride 0.9 % bolus 1,000 mL (0 mLs Intravenous Stopped 05/09/24 1533)  metoCLOPramide (REGLAN) injection 10 mg (10 mg Intravenous Given 05/09/24 1425)  diphenhydrAMINE (BENADRYL) injection 25 mg (25 mg Intravenous Given 05/09/24 1424)  sodium chloride 0.9 % bolus 1,000 mL (1,000 mLs Intravenous New Bag/Given 05/09/24 1536)    ED Course/ Medical Decision Making/ A&P    Patient seen and examined. History obtained directly from patient.   Labs/EKG: Ordered CBC, CMP, lipase, UA, pregnancy.  Imaging: None ordered  Medications/Fluids: Ordered: IV fluid bolus, IV Reglan/Benadryl for headache as well as nausea.  Most recent vital signs reviewed and are as follows: BP (!) 133/90   Pulse 100   Temp 98.1 F (36.7 C) (Oral)   Resp 16   Ht 5' (1.524 m)   Wt 75.8 kg   SpO2 99%   BMI 32.61 kg/m   Initial impression: Nausea and vomiting, dehydration.  3:51 PM Reassessment performed. Patient appears comfortable, stable.  She is eating peanut butter crackers.  Labs personally reviewed and interpreted including: CBC was unremarkable; CMP with glucose 167, bicarb 16, creatinine minimally elevated at 1.06 with normal BUN, anion gap was 22; lipase 25; UA with 80 ketones, otherwise no compelling signs of infection.  Reviewed pertinent lab work and imaging with patient at bedside. Questions answered.   Most current vital signs reviewed and are as follows: BP (!) 133/90   Pulse 100   Temp 98.1 F (36.7 C) (Oral)   Resp 16   Ht 5' (1.524 m)   Wt 75.8 kg   SpO2  99%   BMI 32.61 kg/m   Plan: Patient will be hydrated, total 2 L.  She is now tolerating orals.  Will recheck her blood sugar after a while.  Her lab workup is suggestive of starvation ketosis due to recent vomiting.  Overall she looks very well.  Agree with acid blocking medication prescribed previously.  Patient states that she has Zofran at home to use.  Encouraged  her to use this on a schedule for the next 24 hours and then as needed.  Discussed bland diet.  5:36 PM Reassessment performed. Patient appears improved.  She did have an episode of diarrhea in the ED.  After IV hydration and initial oral challenge, patient was feeling better.  Glucose was checked but had dropped to 64.  Patient was given several cups of juice and additional peanut butter crackers.  Blood sugar improved into the 90s.  Reviewed pertinent lab work and imaging with patient at bedside. Questions answered.   Most current vital signs reviewed and are as follows: BP (!) 133/90 (BP Location: Right Arm)   Pulse 100   Temp 98.1 F (36.7 C) (Oral)   Resp 18   Ht 5' (1.524 m)   Wt 75.8 kg   SpO2 99%   BMI 32.61 kg/m   Plan: Discharge to home.   Prescriptions written for: Reglan  Other home care instructions discussed: Maintain good oral intake, maintain hydration with sports drinks containing glucose including Gatorade or Pedialyte.  ED return instructions discussed: The patient was urged to return to the Emergency Department immediately with worsening of current symptoms, worsening abdominal pain, persistent vomiting, blood noted in stools, fever, or any other concerns. The patient verbalized understanding.   Follow-up instructions discussed: Patient encouraged to follow-up with their PCP in 3 days.                                 Medical Decision Making Amount and/or Complexity of Data Reviewed Labs: ordered.  Risk Prescription drug management.   For this patient's complaint of N/V, the following  conditions were considered on the differential diagnosis: gastritis/PUD, enteritis/duodenitis, appendicitis, cholelithiasis/cholecystitis, cholangitis, pancreatitis, ruptured viscus, colitis, diverticulitis, small/large bowel obstruction, proctitis, cystitis, pyelonephritis, ureteral colic, aortic dissection, aortic aneurysm. In women, ectopic pregnancy, pelvic inflammatory disease, ovarian cysts, and tubo-ovarian abscess were also considered. Atypical chest etiologies were also considered including ACS, PE, and pneumonia.  Patient without abdominal pain on exam.  White blood cell count was normal.  Unclear etiology of nausea and vomiting however I do agree with acid blocking medications and antiemetics.  Patient did have signs of significant dehydration.  This included hypoglycemia, elevated bicarb and anion gap.  She was treated with IV fluids.  After antiemetics, she is doing a very good job keeping down juice and crackers.  Blood sugar is gradually improving.  Given her well appearance, no concerns about discharge at this time.  The patient's vital signs, pertinent lab work and imaging were reviewed and interpreted as discussed in the ED course. Hospitalization was considered for further testing, treatments, or serial exams/observation. However as patient is well-appearing, has a stable exam, and reassuring studies today, I do not feel that they warrant admission at this time. This plan was discussed with the patient who verbalizes agreement and comfort with this plan and seems reliable and able to return to the Emergency Department with worsening or changing symptoms.             Final Clinical Impression(s) / ED Diagnoses Final diagnoses:  Nausea and vomiting, unspecified vomiting type  Starvation ketoacidosis    Rx / DC Orders ED Discharge Orders          Ordered    metoCLOPramide (REGLAN) 10 MG tablet  Every 6 hours        05/09/24 1734  Lyna Sandhoff,  PA-C 05/09/24 1738    Hershel Los, MD 05/09/24 2039

## 2024-05-09 NOTE — ED Triage Notes (Signed)
 Nausea since Tuesday abd vomiting since wed  was seen at [facility] and sent here for further

## 2024-05-09 NOTE — Discharge Instructions (Signed)
 Please read and follow all provided instructions.  Your diagnoses today include:  1. Nausea and vomiting, unspecified vomiting type   2. Starvation ketoacidosis     Tests performed today include: Complete blood cell count: Was normal Complete metabolic panel: Showed signs of poor caloric intake with low blood sugar and elevated bicarbonate in the blood Lipase (pancreas function test): Was normal Urinalysis (urine test): Ketones were noted in the urine, no signs of infection Pregnancy test (urine or blood, in women only): Negative Vital signs. See below for your results today.   Medications prescribed:  Reglan: Medication for nausea and vomiting  Take any prescribed medications only as directed.  Home care instructions:  Follow any educational materials contained in this packet.  You should rest for the next several days. Keep drinking plenty of fluids and use the medicine for nausea as directed.   Drink clear liquids for the next 24 hours and introduce solid foods slowly after 24 hours using the b.r.a.t. diet (Bananas, Rice, Applesauce, Toast, Yogurt).    Follow-up instructions: Please follow-up with your primary care provider in the next 3 days for further evaluation of your symptoms. If you are not feeling better in 72 hours you may have a condition that is more serious and you need re-evaluation.   Return instructions:  SEEK IMMEDIATE MEDICAL ATTENTION IF: If you have pain that does not go away or becomes severe  A temperature above 101F develops  Repeated vomiting occurs (multiple episodes)  If you have pain that becomes localized to portions of the abdomen. The right side could possibly be appendicitis. In an adult, the left lower portion of the abdomen could be colitis or diverticulitis.  Blood is being passed in stools or vomit (bright red or black tarry stools)  You develop chest pain, difficulty breathing, dizziness or fainting, or become confused, poorly responsive, or  inconsolable (young children) If you have any other emergent concerns regarding your health  Additional Information: Abdominal (belly) pain can be caused by many things. Your caregiver performed an examination and possibly ordered blood/urine tests and imaging (CT scan, x-rays, ultrasound). Many cases can be observed and treated at home after initial evaluation in the emergency department. Even though you are being discharged home, abdominal pain can be unpredictable. Therefore, you need a repeated exam if your pain does not resolve, returns, or worsens. Most patients with abdominal pain don't have to be admitted to the hospital or have surgery, but serious problems like appendicitis and gallbladder attacks can start out as nonspecific pain. Many abdominal conditions cannot be diagnosed in one visit, so follow-up evaluations are very important.  Your vital signs today were: BP (!) 133/90 (BP Location: Right Arm)   Pulse 100   Temp 98.1 F (36.7 C) (Oral)   Resp 18   Ht 5' (1.524 m)   Wt 75.8 kg   SpO2 99%   BMI 32.61 kg/m  If your blood pressure (bp) was elevated above 135/85 this visit, please have this repeated by your doctor within one month. --------------

## 2024-12-10 ENCOUNTER — Other Ambulatory Visit: Payer: Self-pay

## 2024-12-10 ENCOUNTER — Encounter (HOSPITAL_BASED_OUTPATIENT_CLINIC_OR_DEPARTMENT_OTHER): Payer: Self-pay

## 2024-12-10 DIAGNOSIS — R10819 Abdominal tenderness, unspecified site: Secondary | ICD-10-CM | POA: Diagnosis not present

## 2024-12-10 DIAGNOSIS — Z91040 Latex allergy status: Secondary | ICD-10-CM | POA: Diagnosis not present

## 2024-12-10 DIAGNOSIS — R0789 Other chest pain: Secondary | ICD-10-CM | POA: Diagnosis present

## 2024-12-10 NOTE — ED Triage Notes (Signed)
 Pt arrives with c/o right sided flank and chest pain that started today. Pt took zofran before arrival. Pt denies dysuria or n/v.

## 2024-12-11 ENCOUNTER — Emergency Department (HOSPITAL_BASED_OUTPATIENT_CLINIC_OR_DEPARTMENT_OTHER)

## 2024-12-11 ENCOUNTER — Emergency Department (HOSPITAL_BASED_OUTPATIENT_CLINIC_OR_DEPARTMENT_OTHER)
Admission: EM | Admit: 2024-12-11 | Discharge: 2024-12-11 | Disposition: A | Discharge status: Home / Self Care | Attending: Emergency Medicine | Admitting: Emergency Medicine

## 2024-12-11 DIAGNOSIS — R0789 Other chest pain: Secondary | ICD-10-CM

## 2024-12-11 LAB — CBC
HCT: 40.3 % (ref 36.0–46.0)
Hemoglobin: 14.1 g/dL (ref 12.0–15.0)
MCH: 30.3 pg (ref 26.0–34.0)
MCHC: 35 g/dL (ref 30.0–36.0)
MCV: 86.5 fL (ref 80.0–100.0)
Platelets: 317 K/uL (ref 150–400)
RBC: 4.66 MIL/uL (ref 3.87–5.11)
RDW: 12.3 % (ref 11.5–15.5)
WBC: 9.6 K/uL (ref 4.0–10.5)
nRBC: 0 % (ref 0.0–0.2)

## 2024-12-11 LAB — BASIC METABOLIC PANEL WITH GFR
Anion gap: 14 (ref 5–15)
BUN: 26 mg/dL — ABNORMAL HIGH (ref 6–20)
CO2: 22 mmol/L (ref 22–32)
Calcium: 9.1 mg/dL (ref 8.9–10.3)
Chloride: 101 mmol/L (ref 98–111)
Creatinine, Ser: 0.8 mg/dL (ref 0.44–1.00)
GFR, Estimated: >60 mL/min
Glucose, Bld: 109 mg/dL — ABNORMAL HIGH (ref 70–99)
Potassium: 4 mmol/L (ref 3.5–5.1)
Sodium: 137 mmol/L (ref 135–145)

## 2024-12-11 LAB — URINALYSIS, ROUTINE W REFLEX MICROSCOPIC
Bilirubin Urine: NEGATIVE
Glucose, UA: NEGATIVE mg/dL
Hgb urine dipstick: NEGATIVE
Ketones, ur: NEGATIVE mg/dL
Leukocytes,Ua: NEGATIVE
Nitrite: NEGATIVE
Protein, ur: NEGATIVE mg/dL
Specific Gravity, Urine: 1.015 (ref 1.005–1.030)
pH: 5 (ref 5.0–8.0)

## 2024-12-11 LAB — TROPONIN T, HIGH SENSITIVITY
Troponin T High Sensitivity: <15 ng/L (ref 0–19)
Troponin T High Sensitivity: <15 ng/L (ref 0–19)

## 2024-12-11 LAB — PREGNANCY, URINE: Preg Test, Ur: NEGATIVE

## 2024-12-11 LAB — HEPATIC FUNCTION PANEL
ALT: 23 U/L (ref 0–44)
AST: 18 U/L (ref 15–41)
Albumin: 4.2 g/dL (ref 3.5–5.0)
Alkaline Phosphatase: 61 U/L (ref 38–126)
Bilirubin, Direct: <0.1 mg/dL (ref 0.0–0.2)
Total Bilirubin: 0.2 mg/dL (ref 0.0–1.2)
Total Protein: 6.9 g/dL (ref 6.5–8.1)

## 2024-12-11 LAB — D-DIMER, QUANTITATIVE: D-Dimer, Quant: 1.46 ug{FEU}/mL — ABNORMAL HIGH (ref 0.00–0.50)

## 2024-12-11 MED ORDER — IOHEXOL 350 MG/ML SOLN
100.0000 mL | Freq: Once | INTRAVENOUS | Status: AC | PRN
  Administered 2024-12-11: 100 mL via INTRAVENOUS

## 2024-12-11 MED ORDER — FENTANYL CITRATE (PF) 50 MCG/ML IJ SOSY
50.0000 ug | PREFILLED_SYRINGE | Freq: Once | INTRAMUSCULAR | Status: AC
  Administered 2024-12-11: 50 ug via INTRAVENOUS
  Filled 2024-12-11: qty 1

## 2024-12-11 MED ORDER — SODIUM CHLORIDE 0.9 % IV BOLUS
1000.0000 mL | Freq: Once | INTRAVENOUS | Status: AC
  Administered 2024-12-11: 1000 mL via INTRAVENOUS

## 2024-12-11 MED ORDER — LIDOCAINE VISCOUS HCL 2 % MT SOLN
15.0000 mL | Freq: Once | OROMUCOSAL | Status: AC
  Administered 2024-12-11: 15 mL via OROMUCOSAL
  Filled 2024-12-11: qty 15

## 2024-12-11 NOTE — ED Provider Notes (Signed)
 " [place] [facility] Provider Note   CSN: [id number] Arrival date & time: 12/10/24  2351     Patient presents with: Flank Pain and Chest Pain   Renee Barrett is a 44 y.o. female.   The history is provided by the patient.  Flank Pain Associated symptoms include chest pain.  Chest Pain Renee Barrett is a 44 y.o. female who presents to the Emergency Department complaining of chest pain.  She presents to the emergency department for evaluation of central chest pain that radiates to bilateral shoulder blades that started at 930.  Pain is overall worsening.  Initially it was coming and going.  Pain initially went to the right side more.  Pain is worse with drinking.  She did have associated sweating episodes.  She does feel short of breath from the pain.  She denies any abdominal pain, nausea, vomiting, leg swelling, fever.  She took Excedrin Migraine without improvement.  No prior surgeries.  No known medical problems.     Prior to Admission medications  Medication Sig Start Date End Date Taking? Authorizing Provider  ALPRAZolam (XANAX) 1 MG tablet Take 1 mg by mouth at bedtime.    [provider]  cyclobenzaprine  (FLEXERIL ) 10 MG tablet Take 10 mg by mouth as needed for muscle spasms. 11/10/18   [provider]  doxycycline  (VIBRAMYCIN ) 100 MG capsule Take 100 mg by mouth 2 (two) times daily. 09/27/21   [provider]  metoCLOPramide  (REGLAN ) 10 MG tablet Take 1 tablet (10 mg total) by mouth every 6 (six) hours. 05/09/24   Geiple, Joshua, PA-C  nitroGLYCERIN  (NITROSTAT ) 0.4 MG SL tablet Place 1 tablet (0.4 mg total) under the tongue every 5 (five) minutes as needed. Patient not taking: Reported on 05/15/2022 10/04/21 01/02/22  Revankar, Rajan R, MD  venlafaxine  XR (EFFEXOR -XR) 37.5 MG 24 hr capsule TAKE 1 CAPSULE BY MOUTH DAILY WITH BREAKFAST. 06/15/22   Onita Duos, MD    Allergies: Hydrocodone , Hydrocodone -acetaminophen , Imitrex   [sumatriptan ], and Latex    Review of Systems  Cardiovascular:  Positive for chest pain.  Genitourinary:  Positive for flank pain.  All other systems reviewed and are negative.   Updated Vital Signs BP (!) 130/94   Pulse 85   Temp 98.1 F (36.7 C)   Resp 15   Wt 81.6 kg   SpO2 100%   BMI 35.15 kg/m   Physical Exam Vitals and nursing note reviewed.  Constitutional:      Appearance: She is well-developed.  HENT:     Head: Normocephalic and atraumatic.  Cardiovascular:     Rate and Rhythm: Normal rate and regular rhythm.     Heart sounds: No murmur heard. Pulmonary:     Effort: Pulmonary effort is normal. No respiratory distress.     Breath sounds: Normal breath sounds.  Abdominal:     Palpations: Abdomen is soft.     Tenderness: There is no guarding or rebound.     Comments: Mild right-sided abdominal tenderness  Musculoskeletal:        General: No tenderness.  Skin:    General: Skin is warm and dry.  Neurological:     Mental Status: She is alert and oriented to person, place, and time.  Psychiatric:        Behavior: Behavior normal.     (all labs ordered are listed, but only abnormal results are displayed) Labs Reviewed  BASIC METABOLIC PANEL WITH GFR - Abnormal; Notable for the following components:  Result Value   Glucose, Bld 109 (*)    BUN 26 (*)    All other components within normal limits  D-DIMER, QUANTITATIVE - Abnormal; Notable for the following components:   D-Dimer, Quant 1.46 (*)    All other components within normal limits  CBC  PREGNANCY, URINE  HEPATIC FUNCTION PANEL  URINALYSIS, ROUTINE W REFLEX MICROSCOPIC  TROPONIN T, HIGH SENSITIVITY  TROPONIN T, HIGH SENSITIVITY    EKG: EKG Interpretation Date/Time:  Thursday December 11 2024 00:01:48 EST Ventricular Rate:  95 PR Interval:  156 QRS Duration:  76 QT Interval:  332 QTC Calculation: 418 R Axis:   -6  Text Interpretation: Sinus rhythm Baseline wander in lead(s) II III aVF  Confirmed by Griselda Norris [phone]) on 12/11/2024 3:50:05 AM  Radiology: CT Angio Chest PE W/Cm &/Or Wo Cm Result Date: 12/11/2024 CLINICAL DATA:  Right-sided flank and chest pain. History of chest wall pain. Abdominal pain. EXAM: CT ANGIOGRAPHY CHEST CT ABDOMEN AND PELVIS WITH CONTRAST TECHNIQUE: Multidetector CT imaging of the chest was performed using the standard protocol during bolus administration of intravenous contrast. Multiplanar CT image reconstructions and MIPs were obtained to evaluate the vascular anatomy. Multidetector CT imaging of the abdomen and pelvis was performed using the standard protocol during bolus administration of intravenous contrast. RADIATION DOSE REDUCTION: This exam was performed according to the departmental dose-optimization program which includes automated exposure control, adjustment of the mA and/or kV according to patient size and/or use of iterative reconstruction technique. CONTRAST:  OMNIPAQUE  IOHEXOL  350 MG/ML SOLN COMPARISON:  Abdomen and pelvis CT 06/27/2024. FINDINGS: CTA CHEST FINDINGS Cardiovascular: The heart size is normal. No substantial pericardial effusion. No thoracic aortic aneurysm. No substantial atherosclerotic calcification of the thoracic aorta. There is no filling defect within the opacified pulmonary arteries to suggest the presence of an acute pulmonary embolus. Mediastinum/Nodes: No mediastinal lymphadenopathy. There is no hilar lymphadenopathy. The esophagus has normal imaging features. There is no axillary lymphadenopathy. Lungs/Pleura: Tiny 3 mm nodules are identified in each upper lobe on images 29 (right upper lobe) and 27 (left upper lobe). No focal airspace consolidation. No pulmonary edema or pleural effusion. Musculoskeletal: No worrisome lytic or sclerotic osseous abnormality. Review of the MIP images confirms the above findings. CT ABDOMEN and PELVIS FINDINGS Hepatobiliary: No suspicious focal abnormality within the liver parenchyma.  Gallbladder is decompressed. No intrahepatic or extrahepatic biliary dilation. Pancreas: No focal mass lesion. No dilatation of the main duct. No intraparenchymal cyst. No peripancreatic edema. Spleen: No splenomegaly. No suspicious focal mass lesion. Adrenals/Urinary Tract: No adrenal nodule or mass. Kidneys unremarkable. No evidence for hydroureter. The urinary bladder appears normal for the degree of distention. Stomach/Bowel: Stomach is unremarkable. No gastric wall thickening. No evidence of outlet obstruction. Duodenum is normally positioned as is the ligament of Treitz. No small bowel wall thickening. No small bowel dilatation. The terminal ileum is normal. The appendix is normal. No gross colonic mass. No colonic wall thickening. Trace left-sided colonic diverticulosis without diverticulitis. Vascular/Lymphatic: No abdominal aortic aneurysm. No abdominal aortic atherosclerotic calcification. There is no gastrohepatic or hepatoduodenal ligament lymphadenopathy. No retroperitoneal or mesenteric lymphadenopathy. No pelvic sidewall lymphadenopathy. Reproductive: There is no adnexal mass. Other: No substantial intraperitoneal free fluid. Musculoskeletal: No worrisome lytic or sclerotic osseous abnormality. Review of the MIP images confirms the above findings. IMPRESSION: 1. No CT evidence for acute pulmonary embolus. 2. No acute findings in the abdomen or pelvis. Specifically, no findings to explain the patient's history of right flank pain. 3.  Tiny 3 mm nodules in each upper lobe. No follow-up needed if patient is low-risk (and has no known or suspected primary neoplasm). Non-contrast chest CT can be considered in 12 months if patient is high-risk. This recommendation follows the consensus statement: Guidelines for Management of Incidental Pulmonary Nodules Detected on CT Images: From the Fleischner Society 2017; Radiology 2017; 284:228-243. Electronically Signed   By: Camellia Candle M.D.   On: 12/11/2024 06:19    CT ABDOMEN PELVIS W CONTRAST Result Date: 12/11/2024 CLINICAL DATA:  Right-sided flank and chest pain. History of chest wall pain. Abdominal pain. EXAM: CT ANGIOGRAPHY CHEST CT ABDOMEN AND PELVIS WITH CONTRAST TECHNIQUE: Multidetector CT imaging of the chest was performed using the standard protocol during bolus administration of intravenous contrast. Multiplanar CT image reconstructions and MIPs were obtained to evaluate the vascular anatomy. Multidetector CT imaging of the abdomen and pelvis was performed using the standard protocol during bolus administration of intravenous contrast. RADIATION DOSE REDUCTION: This exam was performed according to the departmental dose-optimization program which includes automated exposure control, adjustment of the mA and/or kV according to patient size and/or use of iterative reconstruction technique. CONTRAST:  OMNIPAQUE  IOHEXOL  350 MG/ML SOLN COMPARISON:  Abdomen and pelvis CT 06/27/2024. FINDINGS: CTA CHEST FINDINGS Cardiovascular: The heart size is normal. No substantial pericardial effusion. No thoracic aortic aneurysm. No substantial atherosclerotic calcification of the thoracic aorta. There is no filling defect within the opacified pulmonary arteries to suggest the presence of an acute pulmonary embolus. Mediastinum/Nodes: No mediastinal lymphadenopathy. There is no hilar lymphadenopathy. The esophagus has normal imaging features. There is no axillary lymphadenopathy. Lungs/Pleura: Tiny 3 mm nodules are identified in each upper lobe on images 29 (right upper lobe) and 27 (left upper lobe). No focal airspace consolidation. No pulmonary edema or pleural effusion. Musculoskeletal: No worrisome lytic or sclerotic osseous abnormality. Review of the MIP images confirms the above findings. CT ABDOMEN and PELVIS FINDINGS Hepatobiliary: No suspicious focal abnormality within the liver parenchyma. Gallbladder is decompressed. No intrahepatic or extrahepatic biliary  dilation. Pancreas: No focal mass lesion. No dilatation of the main duct. No intraparenchymal cyst. No peripancreatic edema. Spleen: No splenomegaly. No suspicious focal mass lesion. Adrenals/Urinary Tract: No adrenal nodule or mass. Kidneys unremarkable. No evidence for hydroureter. The urinary bladder appears normal for the degree of distention. Stomach/Bowel: Stomach is unremarkable. No gastric wall thickening. No evidence of outlet obstruction. Duodenum is normally positioned as is the ligament of Treitz. No small bowel wall thickening. No small bowel dilatation. The terminal ileum is normal. The appendix is normal. No gross colonic mass. No colonic wall thickening. Trace left-sided colonic diverticulosis without diverticulitis. Vascular/Lymphatic: No abdominal aortic aneurysm. No abdominal aortic atherosclerotic calcification. There is no gastrohepatic or hepatoduodenal ligament lymphadenopathy. No retroperitoneal or mesenteric lymphadenopathy. No pelvic sidewall lymphadenopathy. Reproductive: There is no adnexal mass. Other: No substantial intraperitoneal free fluid. Musculoskeletal: No worrisome lytic or sclerotic osseous abnormality. Review of the MIP images confirms the above findings. IMPRESSION: 1. No CT evidence for acute pulmonary embolus. 2. No acute findings in the abdomen or pelvis. Specifically, no findings to explain the patient's history of right flank pain. 3. Tiny 3 mm nodules in each upper lobe. No follow-up needed if patient is low-risk (and has no known or suspected primary neoplasm). Non-contrast chest CT can be considered in 12 months if patient is high-risk. This recommendation follows the consensus statement: Guidelines for Management of Incidental Pulmonary Nodules Detected on CT Images: From the Fleischner Society 2017; Radiology 2017;  [id number]. Electronically Signed   By: Camellia Candle M.D.   On: 12/11/2024 06:19   DG Chest 2 View Result Date: 12/11/2024 EXAM: 2 VIEW(S) XRAY OF  THE CHEST 12/11/2024 12:39:48 AM COMPARISON: None available. CLINICAL HISTORY: CP FINDINGS: LUNGS AND PLEURA: No focal pulmonary opacity. No pleural effusion. No pneumothorax. HEART AND MEDIASTINUM: No acute abnormality of the cardiac and mediastinal silhouettes. BONES AND SOFT TISSUES: No acute osseous abnormality. IMPRESSION: 1. No acute process. Electronically signed by: Elsie Gravely MD 12/11/2024 12:46 AM EST RP Workstation: [id number]     Procedures   Medications Ordered in the ED  sodium chloride  0.9 % bolus 1,000 mL (0 mLs Intravenous Stopped 12/11/24 0603)  fentaNYL  (SUBLIMAZE ) injection 50 mcg (50 mcg Intravenous Given 12/11/24 0432)  lidocaine  (XYLOCAINE ) 2 % viscous mouth solution 15 mL (15 mLs Mouth/Throat Given 12/11/24 0431)  iohexol  (OMNIPAQUE ) 350 MG/ML injection 100 mL (100 mLs Intravenous Contrast Given 12/11/24 0555)                                    Medical Decision Making Amount and/or Complexity of Data Reviewed Labs: ordered. Radiology: ordered.  Risk Prescription drug management.   Patient here for evaluation of chest pain, right-sided abdominal pain.  She is nontoxic-appearing on evaluation with no respiratory distress.  EKG is nonischemic and troponins are negative.  She is low risk for PE/DVT.  D-dimer is elevated.  CTA was obtained, which is negative for PE, negative for acute abnormality in her abdomen.  CT does demonstrate incidental findings of pulmonary nodules.  Patient is a low risk, non-smoker no family history of lung cancer.  Discussed that she will not need to follow-up for these.  Discussed with patient unclear source of her symptoms.  Discussed treating for reflux with OTC PPI.  She does have these medications at home.  Feel she is stable to discharge home with outpatient follow-up and return precautions.     Final diagnoses:  Atypical chest pain    ED Discharge Orders     None          Griselda Norris, MD 12/11/24 [phone]  "

## 2025-01-08 ENCOUNTER — Telehealth: Payer: Self-pay

## 2025-01-08 NOTE — Telephone Encounter (Signed)
 Received call from patient regarding test results from 1/8. Patient has questions about the noted findings.   Message sent to ordering MD via Epic, as patient requested to speak with her: Hi there! I just received a call from Ms. Harl. She has questions about her scans from 1/8. I encouraged her to discuss with her primary care doctor. But, she would appreciate if you could reach out to her either through MyChart or at [phone]. Thanks!  CM encouraged patient to contact her insurance's member services for assistance in locating a new pcp as she has not seen her previous provider in 4 years.  No further needs reported at this time.  Merilee Batty, MSN, RN Case Management [phone]
